# Patient Record
Sex: Male | Born: 1935
Health system: Southern US, Community
[De-identification: ages and names within clinical notes are randomized; demographics above are authoritative.]

## PROBLEM LIST (undated history)

## (undated) DIAGNOSIS — I10 Essential (primary) hypertension: Secondary | ICD-10-CM

## (undated) DIAGNOSIS — I219 Acute myocardial infarction, unspecified: Secondary | ICD-10-CM

## (undated) DIAGNOSIS — K219 Gastro-esophageal reflux disease without esophagitis: Secondary | ICD-10-CM

## (undated) DIAGNOSIS — I639 Cerebral infarction, unspecified: Secondary | ICD-10-CM

## (undated) DIAGNOSIS — I2510 Atherosclerotic heart disease of native coronary artery without angina pectoris: Secondary | ICD-10-CM

## (undated) HISTORY — DX: Atherosclerotic heart disease of native coronary artery without angina pectoris: I25.10

## (undated) HISTORY — DX: Gastro-esophageal reflux disease without esophagitis: K21.9

## (undated) HISTORY — DX: Cerebral infarction, unspecified: I63.9

## (undated) HISTORY — DX: Acute myocardial infarction, unspecified: I21.9

---

## 2006-05-01 HISTORY — PX: COLON SURGERY: SHX602

## 2010-05-01 HISTORY — PX: HERNIA REPAIR: SHX51

## 2010-05-01 HISTORY — PX: CHOLECYSTECTOMY: SHX55

## 2011-05-02 HISTORY — PX: CARDIAC CATHETERIZATION: SHX172

## 2011-10-19 ENCOUNTER — Encounter: Payer: Self-pay | Admitting: *Deleted

## 2011-10-19 NOTE — PMR Pre-admission (Signed)
[facility] PMR Admission Coordinator Pre-Admission Assessment  Patient: Shawn Wiley is an 76 y.o., male MRN: [id number] DOB: 1935/10/12 Height: 5\' 10"  (177.8 cm) Weight: 74.844 kg (165 lb)  Insurance Information HMO:      PPO:       PCP:       IPA:       80/20:       OTHER:   PRIMARY: Medicare A/B      Policy#: [id number]      Subscriber: Benna Dunks CM Name:        Phone#:       Fax#:   Pre-Cert#:        Employer: Retired Benefits:  Phone #:       Name: Armed forces training and education officer. Date: 11/30/02     Deduct: $1184      Out of Pocket Max: none      Life Max: unlimited CIR: 100%      SNF: 100 days  LBD = 02/17/11 Outpatient: 80%     Co-Pay: 20% Home Health: 100%      Co-Pay: none DME: 80%     Co-Pay: 20% Providers: patient's choice  SECONDARY: BCBS      Policy#: [id number]      Subscriber: Higinio Roger CM Name:        Phone#:       Fax#:   Pre-Cert#:        Employer: Retired Benefits:  Phone #:       Name:   Eff. Date:       Deduct:        Out of Pocket Max:        Life Max:   CIR:        SNF:   Outpatient:       Co-Pay:   Home Health:        Co-Pay:   DME:       Co-Pay:    Emergency Contact Information Contact Information    Name Relation Home Work [place] Spouse [phone]  [phone]      Current Medical History  Patient Admitting Diagnosis:  R Frontal-Parietal CVA   History of Present Illness:  Was admitted to [facility] for ERCP secondary to jaundice.  Code Blue called due to developing respiratory distress.  Patient treated for hypoxia and bradycardia.  EKG showed NSTEMI and transferred to ICU.  Surgery consulted for jaundice Dr. Vinson Moselle and Dr. Jennye Boroughs.  Felt to be obstructive jaundice.  Status post cholecystectomy in the past.  ERCP was unremarkable.  Bilirubin elevated.  Cardiology consulted.  MRI 06/13 showed acute/subacute right frontal and parietal CVA.  Transferred to [facility] for cardiac cath on 06/19.   Cardiac cath done 06/20 with no interventions.      NIH Stroke scale: Glascow Coma Scale:  Past Medical History  Past Medical History  Diagnosis Date  . Coronary artery disease   . Myocardial infarction   . Stroke   . GERD (gastroesophageal reflux disease)     Family History  family history is not on file.  Prior Rehab/Hospitalizations: No previous rehab admissions.  Current Medications Zosyn, Metoprolol, lisinopril, lovenox, crestor, ASA, Keppra, Protonix, ceftin    Patients Current Diet:  Dysphagia 3, thin liquids  Precautions / Restrictions Precautions Precautions: Fall Precautions/Special Needs: Swallowing   Prior Activity Level Community (5-7x/wk): Went out daily.  Walked and went to the gym.  Very active.  Home Assistive Devices / Equipment Home  Assistive Devices/Equipment: Eyeglasses   Prior Functional Level Current Functional Level  Bed Mobility  Independent  Max assist   Transfers  Independent  Mod assist   Mobility - Walk/Wheelchair  Independent  Total assist   Upper Body Dressing  Independent  Min assist   Lower Body Dressing  Independent  Max assist   Grooming  Independent  Min assist   Eating/Drinking  Independent  Min assist   Toilet Transfer  Independent  Max assist   Bladder Continence   WNL      Bowel Management  Ileostomy  Ileostomy   Stair Climbing  Able to Take Stairs?: Yes  Other   Communication  WNL  Slurred speech   Memory  WNL  impaired/confused   Cooking/Meal Prep  I      Housework  I    Money Management  I    Driving  Yes     Previous Home Environment Living Arrangements: Spouse/significant other Lives With: Spouse Available Help at Discharge: Family;Available 24 hours/day Type of Home: House Home Layout: One level Home Access: Stairs to enter [employer] of Steps: 4  Discharge Living Setting Plans for Discharge Living Setting: Patient's home;House;Lives with (comment)  (Lives with wife.) Type of Home at Discharge: House Discharge Home Layout: One level Discharge Home Access: Stairs to enter Entrance Stairs-Number of Steps: 4 Do you have any problems obtaining your medications?: No  Social/Family/Support Systems Patient Roles: Spouse;Parent;Other (Comment) Psychiatric nurse at [employer] Information: Jiaire Rosebrook - wife (h) [phone] ([phone] Anticipated Caregiver: wife  Ability/Limitations of Caregiver: wife can assist.  Dtr lives close by. Caregiver Availability: 24/7 Discharge Plan Discussed with Primary Caregiver: Yes Is Caregiver In Agreement with Plan?: Yes Does Caregiver/Family have Issues with Lodging/Transportation while Pt is in Rehab?: No  Goals/Additional Needs Patient/Family Goal for Rehab: PT/OT min A/S goals, ST S goals Expected length of stay: 2-3 weeks Cultural Considerations: Baptist.  Active in church. Dietary Needs: Dys 3 thin liquids Equipment Needs: TBD Pt/Family Agrees to Admission and willing to participate: Yes Program Orientation Provided & Reviewed with Pt/Caregiver Including Roles  & Responsibilities: Yes  Patient Condition: I visited this patient and spoke with patient and wife.  I reviewed the information with Dr. Riley Kill on 10/18/11 and received approval for admission once cardiac cath was complete.  Preadmission Screen Completed By:  Trish Mage, 10/19/2011 1:08 PM ______________________________________________________________________   Discussed status with Dr. Riley Kill on 10/20/11 at 1059 and received telephone approval for admission today.  Admission Coordinator:  Trish Mage, time 1059 Dorna Bloom 10/20/11

## 2011-10-20 ENCOUNTER — Inpatient Hospital Stay (HOSPITAL_COMMUNITY): Admit: 2011-10-20 | Payer: Medicare Other | Admitting: Physical Medicine & Rehabilitation

## 2011-10-20 ENCOUNTER — Other Ambulatory Visit: Payer: Self-pay | Admitting: Physician Assistant

## 2011-10-20 ENCOUNTER — Inpatient Hospital Stay (HOSPITAL_COMMUNITY)
Admission: RE | Admit: 2011-10-20 | Discharge: 2011-11-10 | DRG: 945 | Disposition: A | Discharge status: Home Health | Payer: Medicare Other | Source: Ambulatory Visit | Attending: Physical Medicine & Rehabilitation | Admitting: Physical Medicine & Rehabilitation

## 2011-10-20 ENCOUNTER — Encounter (HOSPITAL_COMMUNITY): Payer: Self-pay | Admitting: Rehabilitation

## 2011-10-20 DIAGNOSIS — R1310 Dysphagia, unspecified: Secondary | ICD-10-CM | POA: Diagnosis present

## 2011-10-20 DIAGNOSIS — I214 Non-ST elevation (NSTEMI) myocardial infarction: Secondary | ICD-10-CM | POA: Diagnosis present

## 2011-10-20 DIAGNOSIS — I6350 Cerebral infarction due to unspecified occlusion or stenosis of unspecified cerebral artery: Secondary | ICD-10-CM | POA: Diagnosis present

## 2011-10-20 DIAGNOSIS — I639 Cerebral infarction, unspecified: Secondary | ICD-10-CM

## 2011-10-20 DIAGNOSIS — D72829 Elevated white blood cell count, unspecified: Secondary | ICD-10-CM | POA: Diagnosis not present

## 2011-10-20 DIAGNOSIS — E785 Hyperlipidemia, unspecified: Secondary | ICD-10-CM | POA: Diagnosis present

## 2011-10-20 DIAGNOSIS — K838 Other specified diseases of biliary tract: Secondary | ICD-10-CM | POA: Diagnosis present

## 2011-10-20 DIAGNOSIS — I6330 Cerebral infarction due to thrombosis of unspecified cerebral artery: Secondary | ICD-10-CM

## 2011-10-20 DIAGNOSIS — I2510 Atherosclerotic heart disease of native coronary artery without angina pectoris: Secondary | ICD-10-CM | POA: Diagnosis present

## 2011-10-20 DIAGNOSIS — Z5189 Encounter for other specified aftercare: Secondary | ICD-10-CM

## 2011-10-20 DIAGNOSIS — I4891 Unspecified atrial fibrillation: Secondary | ICD-10-CM | POA: Diagnosis present

## 2011-10-20 DIAGNOSIS — G4700 Insomnia, unspecified: Secondary | ICD-10-CM | POA: Diagnosis not present

## 2011-10-20 DIAGNOSIS — G8190 Hemiplegia, unspecified affecting unspecified side: Secondary | ICD-10-CM | POA: Diagnosis present

## 2011-10-20 HISTORY — DX: Essential (primary) hypertension: I10

## 2011-10-20 MED ORDER — CEFUROXIME AXETIL 500 MG PO TABS
500.0000 mg | ORAL_TABLET | Freq: Two times a day (BID) | ORAL | Status: AC
  Administered 2011-10-20 – 2011-10-25 (×10): 500 mg via ORAL
  Filled 2011-10-20 (×12): qty 1

## 2011-10-20 MED ORDER — PANTOPRAZOLE SODIUM 40 MG PO TBEC
40.0000 mg | DELAYED_RELEASE_TABLET | Freq: Every day | ORAL | Status: DC
  Administered 2011-10-21 – 2011-11-10 (×21): 40 mg via ORAL
  Filled 2011-10-20 (×23): qty 1

## 2011-10-20 MED ORDER — B COMPLEX-C PO TABS
1.0000 | ORAL_TABLET | Freq: Every day | ORAL | Status: DC
  Administered 2011-10-21 – 2011-11-10 (×21): 1 via ORAL
  Filled 2011-10-20 (×24): qty 1

## 2011-10-20 MED ORDER — TAB-A-VITE/IRON PO TABS
1.0000 | ORAL_TABLET | Freq: Every day | ORAL | Status: DC
  Administered 2011-10-21 – 2011-11-10 (×21): 1 via ORAL
  Filled 2011-10-20 (×25): qty 1

## 2011-10-20 MED ORDER — SORBITOL 70 % SOLN: 30.0000 mL | Freq: Every day | Status: DC | PRN

## 2011-10-20 MED ORDER — RIVAROXABAN 10 MG PO TABS
20.0000 mg | ORAL_TABLET | Freq: Every day | ORAL | Status: DC
  Administered 2011-10-20 – 2011-11-09 (×21): 20 mg via ORAL
  Filled 2011-10-20 (×23): qty 2

## 2011-10-20 MED ORDER — ACETAMINOPHEN 325 MG PO TABS
325.0000 mg | ORAL_TABLET | ORAL | Status: DC | PRN
  Administered 2011-10-23 – 2011-11-09 (×13): 650 mg via ORAL
  Filled 2011-10-20 (×13): qty 2

## 2011-10-20 MED ORDER — ONDANSETRON HCL 4 MG PO TABS: 4.0000 mg | ORAL_TABLET | Freq: Four times a day (QID) | ORAL | Status: DC | PRN

## 2011-10-20 MED ORDER — NITROGLYCERIN 0.4 MG SL SUBL: 0.4000 mg | SUBLINGUAL_TABLET | SUBLINGUAL | Status: DC | PRN

## 2011-10-20 MED ORDER — ASPIRIN EC 81 MG PO TBEC
81.0000 mg | DELAYED_RELEASE_TABLET | Freq: Every day | ORAL | Status: DC
  Administered 2011-10-21 – 2011-11-10 (×21): 81 mg via ORAL
  Filled 2011-10-20 (×22): qty 1

## 2011-10-20 MED ORDER — LISINOPRIL 10 MG PO TABS
10.0000 mg | ORAL_TABLET | Freq: Every day | ORAL | Status: DC
  Administered 2011-10-21 – 2011-11-04 (×14): 10 mg via ORAL
  Filled 2011-10-20 (×20): qty 1

## 2011-10-20 MED ORDER — POLYETHYLENE GLYCOL 3350 17 G PO PACK
17.0000 g | PACK | Freq: Every day | ORAL | Status: DC | PRN
  Filled 2011-10-20: qty 1

## 2011-10-20 MED ORDER — ONDANSETRON HCL 4 MG/2ML IJ SOLN: 4.0000 mg | Freq: Four times a day (QID) | INTRAMUSCULAR | Status: DC | PRN

## 2011-10-20 MED ORDER — METOPROLOL TARTRATE 25 MG PO TABS
25.0000 mg | ORAL_TABLET | Freq: Two times a day (BID) | ORAL | Status: DC
  Administered 2011-10-20 – 2011-11-04 (×31): 25 mg via ORAL
  Filled 2011-10-20 (×37): qty 1

## 2011-10-20 MED ORDER — ASPIRIN 325 MG PO TABS
325.0000 mg | ORAL_TABLET | Freq: Every day | ORAL | Status: DC
  Filled 2011-10-20: qty 1

## 2011-10-20 NOTE — H&P (Incomplete)
Physical Medicine and Rehabilitation Admission H&P    No chief complaint on file. : HPI: 76 year old right-handed male with previous subtotal colectomy, ileostomy and cholecystectomy. Admitted to [facility] 10/09/2011 with abdominal pain. Patient states that on 10/06/2011 (having abdominal discomfort in the right upper quadrant as well as left upper quadrant. He stated that his ileostomy is working regularly. Patient became jaundice as well his mild elevation in liver function studies. Noted bilirubin of 4.1. Patient plan was for ERCP per Dr. Derenda Fennel. During procedure CODE BLUE was called as patient developed acute onset of respiratory distress and hypoxia with bradycardia. Initial heart rates were in the 40s and oxygen saturations in the 70s. EKG showed ST segment elevation myocardial infarction, he was transferred to intensive care unit. Noted by ERCP felt to be obstructive jaundice. He also undergone endoscopic retrograde cholangiopancreatography that was unremarkable. Cardiology was consulted and echocardiogram completed that showed mild concentric left ventricular hypertrophy with ejection fraction 65%. Patient with noted left-sided weakness and slurred speech after ERCP. MRI of the brain June 13 showed restricted diffusion in the right frontal and parietal cortex compatible with acute or subacute infarct. No hemorrhage or mass noted. CTA of the chest showed no pulmonary embolism. Carotid Doppler showed calcified plaque bilaterally in the carotid bulbs around 50-69% stenosis suspected bilaterally. Patient was placed on aspirin therapy as well as subcutaneous Lovenox for DVT prophylaxis. Pertaining to his obstructive jaundice he had been placed on Zosyn and changed to Ceftin which was felt to be well for any cholecystitis type problems and patient remained afebrile. Therapies were initiated with slow progress. Patient noted left hemiplegia with neglect which limited his overall mobility.  He was transferred to [facility] 10/18/2011 for heart catheterization after acute non-ST segment elevation myocardial infarction with cardiac cath unremarkable and advised medical regimen of continuing aspirin but his subcutaneous Lovenox was discontinued at that time and placed on Xarelto for paroxysmal atrial flutter. Patient was admitted to [facility] inpatient rehabilitation services for comprehensive rehabilitation program secondary to deconditioning after CVA/myocardial infarction  Review of Systems  Gastrointestinal: Positive for nausea and abdominal pain.  All other systems reviewed and are negative.   Past Medical History  Diagnosis Date  . Coronary artery disease   . Myocardial infarction   . Stroke   . GERD (gastroesophageal reflux disease)    Past Surgical History  Procedure Date  . Colon surgery   . Cholecystectomy   . Hernia repair    No family history on file. Social History:  does not have a smoking history on file. He does not have any smokeless tobacco history on file. His alcohol and drug histories not on file. Allergies: NKA  (Not in a hospital admission)  Home: Patient is married and retired. 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 They live in a one level home with 4 steps to entry. Patient denies any tobacco or alcohol use. Wife and family can assist        Functional History:   Independent prior to admission and active  Functional Status:  Mobility:   Currently requires max assist for bed mobility moderate to max assist transfers        ADL:   min mod assist for ADLs  Cognition:   patient with dysarthric speech but intelligible.     There were no vitals taken for this visit. 118/70 P 74 RR 18 T 99 Physical Exam  Vitals reviewed. Constitutional: He appears well-developed.  HENT:  Head: Normocephalic.  Neck: Neck supple. No thyromegaly present.  Cardiovascular: Regular rhythm.   Pulmonary/Chest: Breath  sounds normal. No respiratory distress. He has no wheezes.  Abdominal: He exhibits no distension. There is no tenderness.  Musculoskeletal: He exhibits no edema.  Neurological: He is alert.       Speech is dysarthric but intelligible. He was able to name person place and his date of birth. He follows simple commands. Patient does display some left-sided neglect.  Skin: Skin is warm and dry.  Psychiatric: He has a normal mood and affect.   ileostomy is in place and draining without difficulty  No results found for this or any previous visit (from the past 48 hour(s)). No results found.  Post Admission Physician Evaluation: 1. Functional deficits secondary  to ***. 2. Patient is admitted to receive collaborative, interdisciplinary care between the physiatrist, rehab nursing staff, and therapy team. 3. Patient's level of medical complexity and substantial therapy needs in context of that medical necessity cannot be provided at a lesser intensity of care such as a SNF. 4. Patient has experienced substantial functional loss from his/her baseline which was documented above under the "Functional History" and "Functional Status" headings.  Judging by the patient's diagnosis, physical exam, and functional history, the patient has potential for functional progress which will result in measurable gains while on inpatient rehab.  These gains will be of substantial and practical use upon discharge  in facilitating mobility and self-care at the household level. 5. Physiatrist will provide 24 hour management of medical needs as well as oversight of the therapy plan/treatment and provide guidance as appropriate regarding the interaction of the two. 6. 24 hour rehab nursing will assist with {due ZO:1096045}  and help integrate therapy concepts, techniques,education, etc. 7. PT will assess and treat for:  ***.  Goals are: ***. 8. OT will assess and treat for: ***.   Goals are: ***. 9. SLP will assess and treat  for: ***.  Goals are: ***. 10. Case Management and Social Worker will assess and treat for psychological issues and discharge planning. 11. Team conference will be held weekly to assess progress toward goals and to determine barriers to discharge. 12. Patient will receive at least 3 hours of therapy per day at least 5 days per week. 13. ELOS: ***      Prognosis:  {potential:3041437}   Medical Problem List and Plan: 1. Right frontal parietal CVA after ERCP 2. DVT Prophylaxis/Anticoagulation: SCDs. Monitor for any signs of DVT. 3. Acute non-ST segment elevation myocardial infarction. Cardiac cath unremarkable. No complaints of chest pain or shortness of breath. Continue medical management with aspirin and Xarelto 4. Obstructive jaundice/history ileostomy. Monitor LFTs and routine care of ileostomy. Continue Protonix 5. Hypertension. Lisinopril 10 mg daily and Lopressor 25 mg twice a day. Monitor with increased mobility 6. PAF. Xarelto 20 mg daily. Cardiac rate controlled 6. Hyperlipidemia. Crestor 7. Dysphagia. Currently on dysphagia 3 nectar liquids. We'll have speech therapy followup   10/20/2011, 6:04 AM

## 2011-10-20 NOTE — Plan of Care (Signed)
Overall Plan of Care [facility]) Patient Details Name: Shawn Wiley MRN: [id number] DOB: 07-17-1935  Diagnosis:  Rehabilitation for CVA  Primary Diagnosis:    CVA (cerebral infarction) Co-morbidities: hx colostomy, deconditioning, HTN, hx jaundice,dysphagia  Functional Problem List  Patient demonstrates impairments in the following areas: Balance, Cognition, Endurance and Motor  Basic ADL's: eating, grooming, bathing, dressing and toileting Advanced ADL's: simple meal preparation  Transfers:  bed mobility, bed to chair, toilet, tub/shower, car, furniture and floor Locomotion:  ambulation, wheelchair mobility and stairs  Additional Impairments:  Swallowing, Communication  expression and Social Cognition   problem solving and awareness  Anticipated Outcomes Item Anticipated Outcome  Eating/Swallowing    Basic self-care  Supervision  Tolieting  Modified independent  Bowel/Bladder  Min assist  Transfers  Min-Assist  Locomotion  Min-Assist   Communication    Cognition  Supervision  Pain  Less than 3  Safety/Judgment    Other     Therapy Plan: PT Frequency: 1-2 X/day, 60-90 minutes OT Frequency: 1-2 X/day, 60-90 minutes SLP Frequency: 1-2 X/day, 30-60 minutes   Team Interventions: Item RN PT OT SLP SW TR Other  Self Care/Advanced ADL Retraining   x      Neuromuscular Re-Education  x x      Therapeutic Activities  x x x     UE/LE Strength Training/ROM  x x      UE/LE Coordination Activities  x x      Visual/Perceptual Remediation/Compensation   x      DME/Adaptive Equipment Instruction  x x      Therapeutic Exercise  x x      Balance/Vestibular Training  x x      Patient/Family Education  x x x     Cognitive Remediation/Compensation    x     Functional Mobility Training  x x      Ambulation/Gait Training  x       Museum/gallery curator  x       Wheelchair Propulsion/Positioning  x x      Functional Programmer, multimedia    x     Speech/Language Facilitation    x     Bladder Management x        Bowel Management x        Disease Management/Prevention         Pain Management x        Medication Management x        Skin Care/Wound Management         Splinting/Orthotics  x       Discharge Planning  x       Psychosocial Support  x  x         x               Team Discharge Planning: Destination:  Home Projected Follow-up:  PT and Home Health Projected Equipment Needs:  TBD Patient/family involved in discharge planning:  Yes  MD ELOS: 3 wks Medical Rehab Prognosis:  Excellent Assessment: 76 yo male with complications after ERCP including R CVA causing L HP now requiring 24/7 Rheab MD/RN, CIR level PT,OT,SLP

## 2011-10-20 NOTE — H&P (Signed)
Physical Medicine and Rehabilitation Admission H&P  No chief complaint on file.  :  HPI: 76 year old right-handed male with previous subtotal colectomy, ileostomy and cholecystectomy. Admitted to [facility] 10/09/2011 with abdominal pain. Patient states that on 10/06/2011 (having abdominal discomfort in the right upper quadrant as well as left upper quadrant. He stated that his ileostomy is working regularly. Patient became jaundice as well his mild elevation in liver function studies. Noted bilirubin of 4.1. Patient plan was for ERCP per Dr. Derenda Fennel. During procedure CODE BLUE was called as patient developed acute onset of respiratory distress and hypoxia with bradycardia. Initial heart rates were in the 40s and oxygen saturations in the 70s. EKG showed ST segment elevation myocardial infarction, he was transferred to intensive care unit. Noted by ERCP felt to be obstructive jaundice. He also undergone endoscopic retrograde cholangiopancreatography that was unremarkable. Cardiology was consulted and echocardiogram completed that showed mild concentric left ventricular hypertrophy with ejection fraction 65%. Patient with noted left-sided weakness and slurred speech after ERCP. MRI of the brain June 13 showed restricted diffusion in the right frontal and parietal cortex compatible with acute or subacute infarct. No hemorrhage or mass noted. CTA of the chest showed no pulmonary embolism. Carotid Doppler showed calcified plaque bilaterally in the carotid bulbs around 50-69% stenosis suspected bilaterally. Patient was placed on aspirin therapy as well as subcutaneous Lovenox for DVT prophylaxis. Pertaining to his obstructive jaundice he had been placed on Zosyn and changed to Ceftin which was felt to be well for any cholecystitis type problems and patient remained afebrile. Therapies were initiated with slow progress. Patient noted left hemiplegia with neglect which limited his overall mobility. He  was transferred to [facility] 10/18/2011 for heart catheterization after acute non-ST segment elevation myocardial infarction with cardiac cath unremarkable and advised medical regimen of continuing aspirin but his subcutaneous Lovenox was discontinued at that time and placed on Xarelto for paroxysmal atrial flutter. Patient was admitted to [facility] inpatient rehabilitation services for comprehensive rehabilitation program secondary to deconditioning after CVA/myocardial infarction  Review of Systems  Gastrointestinal: Positive for nausea and abdominal pain.  All other systems reviewed and are negative.   Past Medical History   Diagnosis  Date   .  Coronary artery disease    .  Myocardial infarction    .  Stroke    .  GERD (gastroesophageal reflux disease)     Past Surgical History   Procedure  Date   .  Colon surgery    .  Cholecystectomy    .  Hernia repair     No family history on file.  Social History: does not have a smoking history on file. He does not have any smokeless tobacco history on file. His alcohol and drug histories not on file.  Allergies: NKA   (Not in a hospital admission)  Home: Patient is married and retired. 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 They live in a one level home with 4 steps to entry. Patient denies any tobacco or alcohol use. Wife and family can assist   Functional History:  Independent prior to admission and active  Functional Status:  Mobility:  Currently requires max assist for bed mobility moderate to max assist transfers     ADL:  min mod assist for ADLs  Cognition:  patient with dysarthric speech but intelligible.   There were no vitals taken for this visit.  118/70 P 74 RR 18 T 99  Physical  Exam  Vitals reviewed.  Constitutional: He appears well-developed. Alert to name, place,reason HENT: oral mucosa pink and moist Head: Normocephalic. sclera slightly icteric Neck: Neck supple. No  thyromegaly present.  Cardiovascular: Regular rhythm. No murmurs rubs or gallops Pulmonary/Chest: Breath sounds normal. No respiratory distress. He has no wheezes.  Abdominal: He exhibits no distension. There is no tenderness. Bowel sounds positive, ostomy intact Musculoskeletal: He exhibits no edema.  Neurological: He is alert.  Speech is dysarthric but intelligible. He was able to name person place and his date of birth. He follows simple commands. Patient does display some left-sided neglect. RUE is 1-2 at deltoid, triceps, trace to absent elsewhere- extensor pattern. LE grossly 0-1/5 with extensor pattern also. DTR's are hyperreflexive at 3+ on the right. 3 beats clonus right ankle with plantar flexor tightness. Sensory 1/2 on right arm and leg. Left facial droop and tongue deviation. Skin: Skin is warm and dry.  Psychiatric: He has a normal mood and affect. Fair insight and awareness. Memory fair.  ileostomy is in place and draining without difficulty  No results found for this or any previous visit (from the past 48 hour(s)).  No results found.    Post Admission Physician Evaluation:  1. Functional deficits secondary to right frontal-parietal infarct with left hemiparesis. 2. Patient is admitted to receive collaborative, interdisciplinary care between the physiatrist, rehab nursing staff, and therapy team. 3. Patient's level of medical complexity and substantial therapy needs in context of that medical necessity cannot be provided at a lesser intensity of care such as a SNF. 4. Patient has experienced substantial functional loss from his/her baseline which was documented above under the "Functional History" and "Functional Status" headings. Judging by the patient's diagnosis, physical exam, and functional history, the patient has potential for functional progress which will result in measurable gains while on inpatient rehab. These gains will be of substantial and practical use upon discharge  in facilitating mobility and self-care at the household level. 5. Physiatrist will provide 24 hour management of medical needs as well as oversight of the therapy plan/treatment and provide guidance as appropriate regarding the interaction of the two. 6. 24 hour rehab nursing will assist with bladder management, bowel management, safety, skin/wound care, disease management, medication administration, pain management and patient education and help integrate therapy concepts, techniques,education, etc. 7. PT will assess and treat for: lower extremity strength, NMR, visuospatial awareness, safety, tone mgt, adaptive equipment, functional mobility and family ed. Goals are: minimal assist. 8. OT will assess and treat for: UES, ROM, ADL's, fxnl mobiltiy, visuospatial awareness,adaptive equipment, NMR, tone mgt, family ed. Goals are: minimal assist. 9. SLP will assess and treat for: cognition, communication, swallowing. Goals are: supervision to min assist. 10. Case Management and Social Worker will assess and treat for psychological issues and discharge planning. 11. Team conference will be held weekly to assess progress toward goals and to determine barriers to discharge. 12. Patient will receive at least 3 hours of therapy per day at least 5 days per week. 13. ELOS: 3 weeks Prognosis: good   Medical Problem List and Plan:  1. Right frontal parietal CVA after ERCP  2. DVT Prophylaxis/Anticoagulation: SCDs. Monitor for any signs of DVT.  3. Acute non-ST segment elevation myocardial infarction. Cardiac cath unremarkable. No complaints of chest pain or shortness of breath. Continue medical management with aspirin and Xarelto  4. Obstructive jaundice/history ileostomy. Monitor LFTs and routine care of ileostomy. Continue Protonix   -pt,wife had been independent with ostomy care PTA  -  serial labs 5. Hypertension. Lisinopril 10 mg daily and Lopressor 25 mg twice a day. Monitor with increased mobility  6.  PAF. Xarelto 20 mg daily. Cardiac rate controlled  6. Hyperlipidemia. Crestor  7. Dysphagia. Currently on dysphagia 3 nectar liquids. We'll have speech therapy followup    8. Tone: ROM, splinting- consider medication if resistant to conservative measures  Ivory Broad, MD 10/20/11

## 2011-10-21 DIAGNOSIS — Z5189 Encounter for other specified aftercare: Secondary | ICD-10-CM

## 2011-10-21 DIAGNOSIS — I6330 Cerebral infarction due to thrombosis of unspecified cerebral artery: Secondary | ICD-10-CM

## 2011-10-21 MED ORDER — ZOLPIDEM TARTRATE 5 MG PO TABS
5.0000 mg | ORAL_TABLET | Freq: Every evening | ORAL | Status: DC | PRN
  Administered 2011-10-21 – 2011-10-24 (×3): 5 mg via ORAL
  Filled 2011-10-21 (×3): qty 1

## 2011-10-21 NOTE — Progress Notes (Signed)
76 year old right-handed male with previous subtotal colectomy, ileostomy and cholecystectomy. Admitted to [facility] 10/09/2011 with abdominal pain. Patient states that on 10/06/2011 (having abdominal discomfort in the right upper quadrant as well as left upper quadrant. He stated that his ileostomy is working regularly. Patient became jaundice as well his mild elevation in liver function studies. Noted bilirubin of 4.1. Patient plan was for ERCP per Dr. Derenda Fennel. During procedure CODE BLUE was called as patient developed acute onset of respiratory distress and hypoxia with bradycardia. Initial heart rates were in the 40s and oxygen saturations in the 70s. EKG showed ST segment elevation myocardial infarction, he was transferred to intensive care unit. Noted by ERCP felt to be obstructive jaundice. He also undergone endoscopic retrograde cholangiopancreatography that was unremarkable. Cardiology was consulted and echocardiogram completed that showed mild concentric left ventricular hypertrophy with ejection fraction 65%. Patient with noted left-sided weakness and slurred speech after ERCP. MRI of the brain June 13 showed restricted diffusion in the right frontal and parietal cortex compatible with acute or subacute infarct. No hemorrhage or mass noted. CTA of the chest showed no pulmonary embolism. Carotid Doppler showed calcified plaque bilaterally in the carotid bulbs around 50-69% stenosis suspected bilaterally. Patient was placed on aspirin therapy as well as subcutaneous Lovenox for DVT prophylaxis. Pertaining to his obstructive jaundice he had been placed on Zosyn and changed to Ceftin which was felt to be well for any cholecystitis type problems and patient remained afebrile. Therapies were initiated with slow progress. Patient noted left hemiplegia with neglect which limited his overall mobility. He was transferred to [facility] 10/18/2011 for heart catheterization after  acute non-ST segment elevation myocardial infarction with cardiac cath unremarkable and advised medical regimen of continuing aspirin but his subcutaneous Lovenox was discontinued at that time and placed on Xarelto for paroxysmal atrial flutter.   Subjective: Patient has no specific complaints today.  BP 117/78  Pulse 76  Temp 98.7 F (37.1 C) (Oral)  Resp 17  Ht 5\' 10"  (1.778 m)  Wt 165 lb 9.1 oz (75.1 kg)  BMI 23.76 kg/m2  SpO2 95%  Elderly male in no acute distress. Neck supple. Chest clear to auscultation cardiac exam S1-S2 are regular. Abdominal exam active bowel sounds, soft.  Post Admission Physician Evaluation:  1. Functional deficits secondary to right frontal-parietal infarct with left hemiparesis. 2. Patient is admitted to receive collaborative, interdisciplinary care between the physiatrist, rehab nursing staff, and therapy team. 3. Patient's level of medical complexity and substantial therapy needs in context of that medical necessity cannot be provided at a lesser intensity of care such as a SNF. 4. Patient has experienced substantial functional loss from his/her baseline which was documented above under the "Functional History" and "Functional Status" headings. Judging by the patient's diagnosis, physical exam, and functional history, the patient has potential for functional progress which will result in measurable gains while on inpatient rehab. These gains will be of substantial and practical use upon discharge in facilitating mobility and self-care at the household level. 5. Physiatrist will provide 24 hour management of medical needs as well as oversight of the therapy plan/treatment and provide guidance as appropriate regarding the interaction of the two. 6. 24 hour rehab nursing will assist with bladder management, bowel management, safety, skin/wound care, disease management, medication administration, pain management and patient education and help integrate therapy  concepts, techniques,education, etc. 7. PT will assess and treat for: lower extremity strength, NMR, visuospatial awareness, safety, tone mgt, adaptive equipment,  functional mobility and family ed. Goals are: minimal assist. 8. OT will assess and treat for: UES, ROM, ADL's, fxnl mobiltiy, visuospatial awareness,adaptive equipment, NMR, tone mgt, family ed. Goals are: minimal assist. 9. SLP will assess and treat for: cognition, communication, swallowing. Goals are: supervision to min assist. 10. Case Management and Social Worker will assess and treat for psychological issues and discharge planning. 11. Team conference will be held weekly to assess progress toward goals and to determine barriers to discharge. 12. Patient will receive at least 3 hours of therapy per day at least 5 days per week. 13. ELOS: 3 weeks Prognosis: good Medical Problem List and Plan:  1. Right frontal parietal CVA after ERCP  2. DVT Prophylaxis/Anticoagulation: SCDs. Monitor for any signs of DVT.  3. Acute non-ST segment elevation myocardial infarction. Cardiac cath unremarkable. No complaints of chest pain or shortness of breath. Continue medical management with aspirin and Xarelto  4. Obstructive jaundice/history ileostomy. Monitor LFTs and routine care of ileostomy. Continue Protonix  -pt,wife had been independent with ostomy care PTA  -serial labs  5. Hypertension. Lisinopril 10 mg daily and Lopressor 25 mg twice a day. Monitor with increased mobility  6. PAF. Xarelto 20 mg daily. Cardiac rate controlled  6. Hyperlipidemia. Crestor  7. Dysphagia. Currently on dysphagia 3 nectar liquids. We'll have speech therapy followup  8. Tone: ROM, splinting- consider medication if resistant to conservative measures

## 2011-10-21 NOTE — Evaluation (Signed)
Physical Therapy Assessment and Plan  Patient Details  Name: Shawn Wiley MRN: [id number] Date of Birth: 16-Mar-1936  PT Diagnosis: Abnormal posture, Abnormality of gait, Hemiparesis non-dominant, Impaired sensation and Muscle weakness Rehab Potential: Good ELOS: 2-3 weeks   Today's Date: 10/21/2011 Time: 0730-0830 Time Calculation (min): 60 min  Problem List:  Patient Active Problem List  Diagnosis  . CVA (cerebral infarction)    Past Medical History:  Past Medical History  Diagnosis Date  . Coronary artery disease   . Myocardial infarction   . Stroke   . GERD (gastroesophageal reflux disease)   . Hypertension    Past Surgical History:  Past Surgical History  Procedure Date  . Hernia repair 2012  . Cholecystectomy 2012  . Colon surgery 2008  . Cardiac catheterization 2013    Assessment & Plan Clinical Impression:76 year old right-handed male with previous subtotal colectomy, ileostomy and cholecystectomy. Admitted to [facility] 10/09/2011 with abdominal pain. Patient states that on 10/06/2011 (having abdominal discomfort in the right upper quadrant as well as left upper quadrant. He stated that his ileostomy is working regularly. Patient became jaundice as well his mild elevation in liver function studies. Noted bilirubin of 4.1. Patient plan was for ERCP per Dr. Derenda Fennel. During procedure CODE BLUE was called as patient developed acute onset of respiratory distress and hypoxia with bradycardia. Initial heart rates were in the 40s and oxygen saturations in the 70s. EKG showed ST segment elevation myocardial infarction, he was transferred to intensive care unit. Noted by ERCP felt to be obstructive jaundice. He also undergone endoscopic retrograde cholangiopancreatography that was unremarkable. Cardiology was consulted and echocardiogram completed that showed mild concentric left ventricular hypertrophy with ejection fraction 65%. Patient with noted left-sided  weakness and slurred speech after ERCP. MRI of the brain June 13 showed restricted diffusion in the right frontal and parietal cortex compatible with acute or subacute infarct. No hemorrhage or mass noted. CTA of the chest showed no pulmonary embolism. Carotid Doppler showed calcified plaque bilaterally in the carotid bulbs around 50-69% stenosis suspected bilaterally. Patient was placed on aspirin therapy as well as subcutaneous Lovenox for DVT prophylaxis. Pertaining to his obstructive jaundice he had been placed on Zosyn and changed to Ceftin which was felt to be well for any cholecystitis type problems and patient remained afebrile. Therapies were initiated with slow progress. Patient noted left hemiplegia with neglect which limited his overall mobility. He was transferred to [facility] 10/18/2011 for heart catheterization after acute non-ST segment elevation myocardial infarction with cardiac cath unremarkable and advised medical regimen of continuing aspirin but his subcutaneous Lovenox was discontinued at that time and placed on Xarelto for paroxysmal atrial flutter. Patient was admitted to [facility] inpatient rehabilitation services for comprehensive rehabilitation program secondary to deconditioning after CVA/myocardial infarction.    Patient transferred to CIR on 10/20/2011 .   Patient currently requires max with mobility secondary to impaired timing and sequencing, abnormal tone, unbalanced muscle activation and decreased coordination.  Prior to hospitalization, patient was Independent with mobility and lived with Spouse in a House home.  Home access is Four 4-inch stepsStairs to enter.  Patient will benefit from skilled PT intervention to maximize safe functional mobility, minimize fall risk and decrease caregiver burden for planned discharge home with 24 hour assist.  Anticipate patient will benefit from follow up [facility] at discharge.  PT - End of Session Endurance  Deficit: No PT Assessment Rehab Potential: Good Barriers to Discharge: None PT Plan  PT Frequency: 1-2 X/day, 60-90 minutes PT Treatment/Interventions: Ambulation/gait training;Balance/vestibular training;Discharge planning;DME/adaptive equipment instruction;Functional mobility training;Neuromuscular re-education;Patient/family education;Splinting/orthotics;Stair training;Therapeutic Activities;Therapeutic Exercise;UE/LE Strength taining/ROM;UE/LE Coordination activities PT Recommendation Follow Up Recommendations: Home health PT  PT Evaluation Precautions/Restrictions Precautions Precautions: Fall Restrictions Weight Bearing Restrictions: No General @[id number] [phone]) Vital Signs Therapy Vitals Temp: 98.7 F (37.1 C) Temp src: Oral Pulse Rate: 76  Resp: 17  BP: 117/78 mmHg Patient Position, if appropriate: Lying Oxygen Therapy SpO2: 95 % O2 Device: None (Room air) Pain Pain Assessment Pain Assessment: No/denies pain Home Living/Prior Functioning Home Living Lives With: Spouse Available Help at Discharge: Family;Available 24 hours/day Type of Home: House Home Access: Stairs to enter [employer] of Steps: Four 4-inch steps Entrance Stairs-Rails: Right Home Layout: One level Bathroom Shower/Tub: Health visitor: Handicapped height Bathroom Accessibility: Yes How Accessible: Accessible via walker Home Adaptive Equipment: Bedside commode/3-in-1;Grab bars in shower;Built-in shower seat;Tub transfer bench;Reacher;Walker - rolling;Straight cane;Quad cane Prior Function Level of Independence: Independent with homemaking with ambulation;Independent with gait Able to Take Stairs?: Reciprically Driving: Yes Vocation: Other (comment) Leisure: Hobbies-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 Vision/Perception  Vision - History Baseline Vision: No visual deficits  Cognition Overall Cognitive Status: Appears within functional  limits for tasks assessed Arousal/Alertness: Awake/alert Orientation Level: Oriented X4 Attention: Sustained Sensation Sensation Light Touch: Impaired by gross assessment (Tingling sensation at L forearm/fingers, L Lower leg/ft) Motor  Motor Motor: Hemiplegia  Mobility Bed Mobility Bed Mobility: Rolling Right;Rolling Left;Right Sidelying to Sit Locomotion  Ambulation Ambulation: Yes Ambulation/Gait Assistance: 2: Max assist Ambulation Distance (Feet): 3 Feet Assistive device: 1 person hand held assist Ambulation/Gait Assistance Details: Tactile cues for sequencing;Tactile cues for weight shifting;Verbal cues for sequencing;Verbal cues for technique;Verbal cues for precautions/safety;Verbal cues for gait pattern;Manual facilitation for weight shifting;Manual facilitation for placement;Manual facilitation for weight bearing Gait Gait: Yes Gait Pattern: Impaired Gait Pattern: Step-to pattern;Decreased stance time - left;Decreased hip/knee flexion - left;Decreased dorsiflexion - left;Decreased weight shift to left;Trunk rotated posteriorly on right Stairs / Additional Locomotion Stairs: No Curb: Not tested (comment) Wheelchair Mobility Wheelchair Mobility: No  Trunk/Postural Assessment  Thoracic Assessment Thoracic Assessment: Exceptions to [facility] (L side weakness) Lumbar Assessment Lumbar Assessment: Exceptions to [facility] (L side weakness) Postural Control Postural Control: Deficits on evaluation (increased posterior pelvic rotation, Right trunk lean)  Balance Balance Balance Assessed: Yes (static sitting with feet supported and R UE support S/Mod-I) Extremity Assessment      RLE Assessment RLE Assessment: Within Functional Limits LLE Assessment LLE Assessment: Exceptions to WFL (ROM limited L ankle dorsiflexion 10 from neutral with tone)  See FIM for current functional status Refer to Care Plan for Long Term Goals  Recommendations for other services: None  Discharge  Criteria: Patient will be discharged from PT if patient refuses treatment 3 consecutive times without medical reason, if treatment goals not met, if there is a change in medical status, if patient makes no progress towards goals or if patient is discharged from hospital.  The above assessment, treatment plan, treatment alternatives and goals were discussed and mutually agreed upon: by patient and by family  Rex Kras 10/21/2011, 8:34 AM

## 2011-10-21 NOTE — Evaluation (Signed)
Occupational Therapy Assessment and Plan  Patient Details  Name: Shawn Wiley MRN: [id number] Date of Birth: 10/07/1935  OT Diagnosis: hemiplegia affecting non-dominant side Rehab Potential: Rehab Potential: Good ELOS: 14-21 days   Today's Date: 10/21/2011 Time: 0900-1005 Time Calculation (min): 65 min  Problem List:  Patient Active Problem List  Diagnosis  . CVA (cerebral infarction)    Past Medical History:  Past Medical History  Diagnosis Date  . Coronary artery disease   . Myocardial infarction   . Stroke   . GERD (gastroesophageal reflux disease)   . Hypertension    Past Surgical History:  Past Surgical History  Procedure Date  . Hernia repair 2012  . Cholecystectomy 2012  . Colon surgery 2008  . Cardiac catheterization 2013    Assessment & Plan Clinical Impression: Patient is a 76 year old right-handed male with previous subtotal colectomy, ileostomy and cholecystectomy. Admitted to [facility] 10/09/2011 with abdominal pain. Patient states that on 10/06/2011 (having abdominal discomfort in the right upper quadrant as well as left upper quadrant. Patient became jaundice as well his mild elevation in liver function studies. Noted bilirubin of 4.1Plan was for ERCP per Dr. Derenda Fennel but during procedure CODE BLUE was called as patient developed acute onset of respiratory distress and hypoxia with bradycardia. Initial heart rates were in the 40s and oxygen saturations in the 70s.   EKG showed ST segment elevation myocardial infarction.    Patient with noted left-sided weakness and slurred speech after ERCP. MRI of the brain June 13 showed restricted diffusion in the right frontal and parietal cortex compatible with acute or subacute infarct. No hemorrhage or mass noted. CTA of the chest showed no pulmonary embolism. Therapies were initiated with slow progress. Patient noted left hemiplegia with neglect which limited his overall mobility. He was transferred to  [facility] 10/18/2011 for heart catheterization after acute non-ST segment elevation myocardial infarction with cardiac cath unremarkable and advised medical regimen of continuing for paroxysmal atrial flutter. Patient was admitted to [facility] inpatient rehabilitation services for comprehensive rehabilitation program secondary to deconditioning after CVA/myocardial infarction.  Patient transferred to CIR on 10/20/2011 .    Patient currently requires max with basic self-care skills secondary to impaired timing and sequencing and unbalanced muscle activation.  Prior to hospitalization, patient could complete BADL and IADL with independently.  Patient will benefit from skilled intervention to increase independence with basic self-care skills prior to discharge home with care partner.  Anticipate patient will require intermittent supervision and follow up home health.  OT - End of Session Activity Tolerance: Tolerates 30+ min activity with multiple rests Endurance Deficit: No OT Assessment Rehab Potential: Good OT Plan OT Frequency: 1-2 X/day, 60-90 minutes Estimated Length of Stay: 14-21 days OT Treatment/Interventions: Balance/vestibular training;DME/adaptive equipment instruction;Functional mobility training;Neuromuscular re-education;Patient/family education;Self Care/advanced ADL retraining;Splinting/orthotics;UE/LE Strength taining/ROM;Therapeutic Exercise;Therapeutic Activities;UE/LE Coordination activities;Visual/perceptual remediation/compensation OT Recommendation Follow Up Recommendations: Home health OT  OT Evaluation Precautions/Restrictions  Precautions Precautions: Fall  General Chart Reviewed: Yes Family/Caregiver Present: Yes (spouse, Shawn Wiley)  Pain Pain Assessment Pain Assessment: No/denies pain  Home Living/Prior Functioning Home Living Lives With: Spouse Available Help at Discharge: Family Type of Home: House Home Access: Stairs to  enter Secretary/administrator of Steps: Four 4-inch steps Entrance Stairs-Rails: Right Home Layout: One level Bathroom Shower/Tub: Health visitor: Handicapped height Bathroom Accessibility: Yes How Accessible: Accessible via walker Home Adaptive Equipment: Bedside commode/3-in-1;Built-in shower seat;Straight cane;Walker - rolling IADL History Homemaking Responsibilities: Yes Meal Prep  Responsibility: Secondary Laundry Responsibility: Secondary Cleaning Responsibility: Secondary Bill Paying/Finance Responsibility: No Shopping Responsibility: Secondary Child Care Responsibility: No Current License: Yes Mode of Transportation: Car Education: HS Occupation: Retired 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, 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) Leisure and Hobbies: boating, church choir Prior Function Level of Independence: Independent with basic ADLs;Independent with transfers;Independent with gait Able to Take Stairs?: Yes Driving: Yes Vocation: Retired Leisure: Hobbies-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  Vision/Perception  Vision - History Baseline Vision: No visual deficits Patient Visual Report: No change from baseline Vision - Assessment Eye Alignment: Within Functional Limits Perception Perception: Within Functional Limits Praxis Praxis: Intact   Cognition Overall Cognitive Status: Appears within functional limits for tasks assessed Arousal/Alertness: Awake/alert Orientation Level: Oriented X4 Attention: Alternating Focused Attention: Appears intact Sustained Attention: Appears intact Alternating Attention: Appears intact Safety/Judgment: Appears intact  Sensation Sensation Light Touch: Appears Intact Coordination Gross Motor Movements are Fluid and Coordinated: No Fine Motor Movements are Fluid and Coordinated: No Motor  Motor Motor: Hemiplegia Motor - Skilled Clinical Observations: left hemiplegia  Mobility  Bed  Mobility Bed Mobility: Rolling Right;Rolling Left;Right Sidelying to Sit   Trunk/Postural Assessment  Postural Control Postural Control: Within Functional Limits   Extremity/Trunk Assessment RUE Assessment RUE Assessment: Within Functional Limits LUE Assessment LUE Assessment: Exceptions to [facility] LUE Strength LUE Overall Strength: Deficits (0/5) LUE Tone LUE Tone: Flaccid  See FIM for current functional status Refer to Care Plan for Long Term Goals  Recommendations for other services: None  Discharge Criteria: Patient will be discharged from OT if patient refuses treatment 3 consecutive times without medical reason, if treatment goals not met, if there is a change in medical status, if patient makes no progress towards goals or if patient is discharged from hospital.  The above assessment, treatment plan, treatment alternatives and goals were discussed and mutually agreed upon: by patient and by family  0900-1005 - 8 Minutes  Individual Therapy  No complaints of pain  Initial 1:1 occupational therapy evaluation completed. Focused skilled intervention on bed mobility, functional use of bilateral UEs, UB/LB bathing, UB/LB dressing, sit/stands, overall activity tolerance/endurance, family ed and training on rehab and recovery post-CVA. Left patient seated in bed side chair with call bell & phone within reach.   Georgeanne Nim 10/21/2011, 12:10 PM

## 2011-10-21 NOTE — Progress Notes (Signed)
Physical Therapy Session Note  Patient Details  Name: Shawn Wiley MRN: [id number] Date of Birth: Oct 06, 1935  Today's Date: 10/21/2011 Time: 1000-1100 Time Calculation (min): 60 min  Short Term Goals: Week 1:  PT Short Term Goal 1 (Week 1): Patient will be able to perform Bed mobility with Mod-Assist. PT Short Term Goal 2 (Week 1): Patient will be able to perform Transfers with Mod-Assist. PT Short Term Goal 3 (Week 1): Patient will be able to propel manual w/c 150' in controlled and household environments with min-Assist. PT Short Term Goal 4 (Week 1): Patient will be able to ascend/descend 2 steps with Max-Assist. PT Short Term Goal 5 (Week 1): Patient will be able to ambulate 51' using LRAD with Max-Assist.  Skilled Therapeutic Interventions/Progress Updates:  Ambulation/gait training;Balance/vestibular training;Discharge planning;DME/adaptive equipment instruction;Functional mobility training;Neuromuscular re-education;Patient/family education;Splinting/orthotics;Stair training;Therapeutic Activities;Therapeutic Exercise;UE/LE Strength taining/ROM;UE/LE Coordination activities   Therapy Documentation Precautions:  Precautions Precautions: Fall Restrictions Weight Bearing Restrictions: No Pain: Pain Assessment Pain Assessment: No/denies pain  Therapeutic Exercise:(15') Supine manual stretching of L ankle into dorsiflexion, Rhythmic Initiation Left LE into hip/knee flexion and extension.  Linearly resisted hip/knee extensions. Therapeutic Activity:(15') Bed mobility roll left Moderate Assist, supine to sit via left side lying Max-Assist, scoot to [facility] Moderate Assist with patient using headboard to assist with scooting.                                           Static and dynamic sitting balance at EOB with B feet supported and Right hand on bed with S/min-Assist                                           Transfer Training: bed to w/c with Max-Assist for stand-pivot transfer  See  FIM for current functional status  Therapy/Group: Individual Therapy  Rex Kras 10/21/2011, 4:40 PM

## 2011-10-21 NOTE — Evaluation (Signed)
Speech Language Pathology Assessment and Plan  Patient Details  Name: Shawn Wiley MRN: [id number] Date of Birth: 18-May-1935  SLP Diagnosis: Dysphagia;Cognitive Impairments;Dysarthria  Rehab Potential: Excellent ELOS: 2-3 weeks   Today's Date: 10/21/2011 Time: 1000-1100 Time Calculation (min): 60 min  Skilled Therapeutic Intervention: Administered BSE and cognitive-linguistic evaluation. Please see below for details.   Problem List:  Patient Active Problem List  Diagnosis  . CVA (cerebral infarction)   Past Medical History:  Past Medical History  Diagnosis Date  . Coronary artery disease   . Myocardial infarction   . Stroke   . GERD (gastroesophageal reflux disease)   . Hypertension    Past Surgical History:  Past Surgical History  Procedure Date  . Hernia repair 2012  . Cholecystectomy 2012  . Colon surgery 2008  . Cardiac catheterization 2013    Assessment / Plan / Recommendation Clinical Impression  Patient was admitted to CIR 10/21/11 secondary to deconditioning after CVA/myocardial infarction and presents with mild oral-pharyngeal dysphagia, mild dysarthria and mild cognitive impairments. Mild oral-pharyngeal dysphagia characterized by delayed swallow initiation of thin liquids with suspected penetration with thin liquids resulting in throat clearing.  Pt demonstrated a timelier swallow without overt s/s of aspiration with nectar thick liquids. Recommend nectar-thick liquids with Dys. 3 textures per pt request due to dentures. Pt also presents with mild dysarthria characterized by decreased intelligibility at the sentence level due to an increased rate and low vocal intensity that requires cueing for pt to self-monitor and correct. Pt's cognitive impairments are characterized by decreased emergent awareness, problem solving and self-monitoring and correcting impacting pt's overall safety and independence. Pt would benefit from skilled SLP intervention to maximize swallow  safety with least restrictive diet, maximize speech intelligibility at the conversation level and maximize cognitive function and overall independence for discharge home.      SLP Assessment  Patient will need skilled Speech Lanaguage Pathology Services during CIR admission    Recommendations  Follow up Recommendations:  (TBD) Equipment Recommended: None recommended by SLP    SLP Frequency 1-2 X/day, 30-60 minutes   SLP Treatment/Interventions Cognitive remediation/compensation;Dysphagia/aspiration precaution training;Internal/external aids;Speech/Language facilitation;Multimodal communication approach;Environmental controls;Cueing hierarchy;Functional tasks;Patient/family education;Therapeutic Activities    Pain Pain Assessment Pain Assessment: No/denies pain Prior Functioning Cognitive/Linguistic Baseline: Within functional limits  Short Term Goals: Week 1: SLP Short Term Goal 1 (Week 1): Pt will demosntrate funcitonal problem solving for basic tasks with supervision verbal and questioning cues.  SLP Short Term Goal 2 (Week 1): Pt will utilize speech intelligbility strategies at the conversation level with supervision verbal and questioning cues.  SLP Short Term Goal 3 (Week 1): Pt will consume trials of thin liquids without overt s/s of aspiraiton with Min verbal cueing.   See FIM for current functional status Refer to Care Plan for Long Term Goals  Recommendations for other services: None  Discharge Criteria: Patient will be discharged from SLP if patient refuses treatment 3 consecutive times without medical reason, if treatment goals not met, if there is a change in medical status, if patient makes no progress towards goals or if patient is discharged from hospital.  The above assessment, treatment plan, treatment alternatives and goals were discussed and mutually agreed upon: by patient and by family  Maelyn Berrey 10/21/2011, 3:35 PM

## 2011-10-22 NOTE — Progress Notes (Signed)
76 year old right-handed male with previous subtotal colectomy, ileostomy and cholecystectomy. Admitted to [facility] 10/09/2011 with abdominal pain. Patient states that on 10/06/2011 (having abdominal discomfort in the right upper quadrant as well as left upper quadrant. He stated that his ileostomy is working regularly. Patient became jaundice as well his mild elevation in liver function studies. Noted bilirubin of 4.1. Patient plan was for ERCP per Dr. Derenda Fennel. During procedure CODE BLUE was called as patient developed acute onset of respiratory distress and hypoxia with bradycardia. Initial heart rates were in the 40s and oxygen saturations in the 70s. EKG showed ST segment elevation myocardial infarction, he was transferred to intensive care unit. Noted by ERCP felt to be obstructive jaundice. He also undergone endoscopic retrograde cholangiopancreatography that was unremarkable. Cardiology was consulted and echocardiogram completed that showed mild concentric left ventricular hypertrophy with ejection fraction 65%. Patient with noted left-sided weakness and slurred speech after ERCP. MRI of the brain June 13 showed restricted diffusion in the right frontal and parietal cortex compatible with acute or subacute infarct. No hemorrhage or mass noted. CTA of the chest showed no pulmonary embolism. Carotid Doppler showed calcified plaque bilaterally in the carotid bulbs around 50-69% stenosis suspected bilaterally. Patient was placed on aspirin therapy as well as subcutaneous Lovenox for DVT prophylaxis. Pertaining to his obstructive jaundice he had been placed on Zosyn and changed to Ceftin which was felt to be well for any cholecystitis type problems and patient remained afebrile. Therapies were initiated with slow progress. Patient noted left hemiplegia with neglect which limited his overall mobility. He was transferred to [facility] 10/18/2011 for heart catheterization after  acute non-ST segment elevation myocardial infarction with cardiac cath unremarkable and advised medical regimen of continuing aspirin but his subcutaneous Lovenox was discontinued at that time and placed on Xarelto for paroxysmal atrial flutter.   Subjective: Patient has no specific complaints today.  BP 108/65  Pulse 69  Temp 98.5 F (36.9 C) (Oral)  Resp 18  Ht 5\' 10"  (1.778 m)  Wt 165 lb 9.1 oz (75.1 kg)  BMI 23.76 kg/m2  SpO2 94%  Elderly male in no acute distress. Neck supple. Chest clear to auscultation cardiac exam S1-S2 are regular. Abdominal exam active bowel sounds, soft.  Post Admission Physician Evaluation:  1. Functional deficits secondary to right frontal-parietal infarct with left hemiparesis. 2. Patient is admitted to receive collaborative, interdisciplinary care between the physiatrist, rehab nursing staff, and therapy team. 3. Patient's level of medical complexity and substantial therapy needs in context of that medical necessity cannot be provided at a lesser intensity of care such as a SNF. 4. Patient has experienced substantial functional loss from his/her baseline which was documented above under the "Functional History" and "Functional Status" headings. Judging by the patient's diagnosis, physical exam, and functional history, the patient has potential for functional progress which will result in measurable gains while on inpatient rehab. These gains will be of substantial and practical use upon discharge in facilitating mobility and self-care at the household level. 5. Physiatrist will provide 24 hour management of medical needs as well as oversight of the therapy plan/treatment and provide guidance as appropriate regarding the interaction of the two. 6. 24 hour rehab nursing will assist with bladder management, bowel management, safety, skin/wound care, disease management, medication administration, pain management and patient education and help integrate therapy  concepts, techniques,education, etc. 7. PT will assess and treat for: lower extremity strength, NMR, visuospatial awareness, safety, tone mgt, adaptive equipment,  functional mobility and family ed. Goals are: minimal assist. 8. OT will assess and treat for: UES, ROM, ADL's, fxnl mobiltiy, visuospatial awareness,adaptive equipment, NMR, tone mgt, family ed. Goals are: minimal assist. 9. SLP will assess and treat for: cognition, communication, swallowing. Goals are: supervision to min assist. 10. Case Management and Social Worker will assess and treat for psychological issues and discharge planning. 11. Team conference will be held weekly to assess progress toward goals and to determine barriers to discharge. 12. Patient will receive at least 3 hours of therapy per day at least 5 days per week. 13. ELOS: 3 weeks Prognosis: good Medical Problem List and Plan:  1. Right frontal parietal CVA after ERCP  2. DVT Prophylaxis/Anticoagulation: SCDs. Monitor for any signs of DVT.  3. Acute non-ST segment elevation myocardial infarction. Cardiac cath unremarkable. No complaints of chest pain or shortness of breath. 4. Obstructive jaundice/history ileostomy. Monitor LFTs and routine care of ileostomy. Continue Protonix  -pt,wife had been independent with ostomy care PTA  -serial labs  5. Hypertension. Lisinopril 10 mg daily and Lopressor 25 mg twice a day. Monitor with increased mobility  BP Readings from Last 3 Encounters:  10/22/11 108/65    6. PAF. Xarelto 20 mg daily. Cardiac rate controlled  6. Hyperlipidemia. Crestor  7. Dysphagia. Currently on dysphagia 3 nectar liquids. We'll have speech therapy followup  8. Tone: ROM, splinting- consider medication if resistant to conservative measures

## 2011-10-22 NOTE — Progress Notes (Signed)
Occupational Therapy Session Note  Patient Details  Name: Shawn Wiley MRN: [id number] Date of Birth: May 27, 1935  Today's Date: 10/22/2011 Time: 0900-1005 Time Calculation (min): 65 min  Skilled Therapeutic Interventions/Progress Updates: ADL on xtra wife 3:1 with drop arm with focus on attention, left side attention (required 3 prompts to locate hand and put in  Safe place); L UE and LE neuro reducation; standing balance and posture.  Wife and [name] cleaned colostomy bag together    Therapy Documentation Precautions:  Precautions Precautions: Fall Restrictions Weight Bearing Restrictions: No  Pain: no c/os   See FIM for current functional status  Therapy/Group: Individual Therapy  Bud Face [facility] 10/22/2011, 3:57 PM

## 2011-10-23 LAB — COMPREHENSIVE METABOLIC PANEL
ALT: 55 U/L — ABNORMAL HIGH (ref 0–53)
Alkaline Phosphatase: 161 U/L — ABNORMAL HIGH (ref 39–117)
BUN: 16 mg/dL (ref 6–23)
CO2: 26 mEq/L (ref 19–32)
Chloride: 93 mEq/L — ABNORMAL LOW (ref 96–112)
GFR calc Af Amer: >90 mL/min (ref >90)
GFR calc non Af Amer: 83 mL/min — ABNORMAL LOW (ref >90)
Glucose, Bld: 99 mg/dL (ref 70–99)
Potassium: 4.7 mEq/L (ref 3.5–5.1)
Sodium: 129 mEq/L — ABNORMAL LOW (ref 135–145)
Total Bilirubin: 1.7 mg/dL — ABNORMAL HIGH (ref 0.3–1.2)

## 2011-10-23 LAB — DIFFERENTIAL
Basophils Relative: 0 % (ref 0–1)
Eosinophils Absolute: 0.3 10*3/uL (ref 0.0–0.7)
Eosinophils Relative: 3 % (ref 0–5)
Lymphs Abs: 2.6 10*3/uL (ref 0.7–4.0)
Neutrophils Relative %: 63 % (ref 43–77)

## 2011-10-23 LAB — CBC
HCT: 35.7 % — ABNORMAL LOW (ref 39.0–52.0)
Hemoglobin: 12.1 g/dL — ABNORMAL LOW (ref 13.0–17.0)
RBC: 3.99 MIL/uL — ABNORMAL LOW (ref 4.22–5.81)

## 2011-10-23 MED ORDER — BOOST / RESOURCE BREEZE PO LIQD
1.0000 | ORAL | Status: DC
  Administered 2011-10-23: 1 via ORAL

## 2011-10-23 MED ORDER — ATORVASTATIN CALCIUM 20 MG PO TABS
20.0000 mg | ORAL_TABLET | Freq: Every day | ORAL | Status: DC
  Administered 2011-10-23 – 2011-11-09 (×18): 20 mg via ORAL
  Filled 2011-10-23 (×21): qty 1

## 2011-10-23 NOTE — Progress Notes (Signed)
Physical Therapy Note  Patient Details  Name: Shawn Wiley MRN: [id number] Date of Birth: Feb 06, 1936 Today's Date: 10/23/2011  13:00-14:00 individual therapy pt denied pain.  Performed stand pivot with max assist with decreased weight in left leg. Transitioned to squat pivot with mod assist to approximate left knee and for facilitation for maintaining forward weight shift. Performed trunk strengthening and weight bearing through left elbow focusing on rotation to decrease tone. Bed mobility initially max assist progressing to mod assist with tactile and visual cues for normal movement with rolling. performed supine bridging with rotation and lumbar rolls x 15 each.   Julian Reil 10/23/2011, 2:00 PM

## 2011-10-23 NOTE — Progress Notes (Signed)
Physical Therapy Note  Patient Details  Name: Shawn Wiley MRN: [id number] Date of Birth: 08-08-1935 Today's Date: 10/23/2011  14:20-15:05 individual therapy pt denied pain.  Pt demonstrated carryover with techniques taught for transfer and bed mobility from previous session with question cues, squat transfers min assist with approximation to left knee with active contractions and facilitation for maintaining anterior weight shift. Pt progressed to ability to maintain trunk control with transfer. Pt required mod assist for sit to side. Gait x 40' with initial 3 musketeer using toe off AFO +2 pt =50% then used eva walker with 2 person assist for facilitating decreased trunk rotation and other person assisting with foot placement. vc and facilitation for hip coming forward over toe and approximation to break tone and to get heel to contact floor. 4 steps with 1 rail and 2 person assist with AFO with assist to place left foot and to prevent severe scissoring.   Julian Reil 10/23/2011, 4:18 PM

## 2011-10-23 NOTE — Progress Notes (Signed)
Occupational Therapy Session Note  Patient Details  Name: Shawn Wiley MRN: [id number] Date of Birth: 29-Oct-1935  Today's Date: 10/23/2011 Time: 0910-1025 Time Calculation (min): 75 min  Short Term Goals: Week 1:  OT Short Term Goal 1 (Week 1): Patient will complete transfers functional transfers with min assist OT Short Term Goal 2 (Week 1): Patient will complete upper body bathing with supervision and setup OT Short Term Goal 3 (Week 1): Patient will complete lower body bathing with min assist OT Short Term Goal 4 (Week 1): Patient will complete upper body dressing with min assist OT Short Term Goal 5 (Week 1): Patient will complete lower body dressing with moderate assist  Skilled Therapeutic Interventions/Progress Updates:  Self care retraining to include shower, dress and groom.  Focus session on safe bed>w/c><shower bench transfers, patient to slow down and focus on task, patient learning the management of LUE and LLE with all mobility and self care tasks, hemi techniques for bath and dress, attend to left side of body, heavy weight bearing onto LLE, hand over hand for LUE to attempt to assist with bath.  When patient attempts to activate left side of body, his entire body responds resulting in abnormal spastice tone (extensor and inversion in LE, flexor/extensor in UE, shoulder elevates and trunk rotates.  Precautions:  Precautions Precautions: Fall Restrictions Weight Bearing Restrictions: No  Pain: Pain Assessment Pain Assessment: 0-10 Pain Score: 0-No pain Faces Pain Scale: No hurt  See FIM for current functional status  Therapy/Group: Individual Therapy.  Wife also present and supportive for entire session.  Wife in need of a right knee replacement and cancelled upcoming surgery due to husband's CVA.  Taquila Leys 10/23/2011, 11:43 AM

## 2011-10-23 NOTE — Progress Notes (Signed)
Per State Regulation 482.30 This chart was reviewed for medical necessity with respect to the patient's Admission/Duration of stay. Pt participating in therapies. D-II diet, thin liquids with supervision. Meryl Dare                 Nurse Care Manager            Next Review Date: 10/26/11

## 2011-10-23 NOTE — Progress Notes (Signed)
Patient information reviewed and entered into UDS-PRO system by Mattingly Fountaine, RN, CRRN, PPS Coordinator.  Information including medical coding and functional independence measure will be reviewed and updated through discharge.     Per nursing patient was given "Data Collection Information Summary for Patients in Inpatient Rehabilitation Facilities with attached "Privacy Act Statement-Health Care Records" upon admission.   

## 2011-10-23 NOTE — Progress Notes (Signed)
Patient displayed unsafe behaviors in room alone; impulsive behavior and poor cognition at times.  Pt. Brought to nurse's station after dinner approx. 1630 for supervision.  Pt. Requested to go back to bed at 1800; bed alarm set and call bell in reach.  Continuing to monitor.

## 2011-10-23 NOTE — Progress Notes (Signed)
Social Work  Social Work Assessment and Plan  Patient Details  Name: Shawn Wiley MRN: [id number] Date of Birth: 1935-05-05  Today's Date: 10/23/2011  Problem List:  Patient Active Problem List  Diagnosis  . CVA (cerebral infarction)   Past Medical History:  Past Medical History  Diagnosis Date  . Coronary artery disease   . Myocardial infarction   . Stroke   . GERD (gastroesophageal reflux disease)   . Hypertension    Past Surgical History:  Past Surgical History  Procedure Date  . Hernia repair 2012  . Cholecystectomy 2012  . Colon surgery 2008  . Cardiac catheterization 2013   Social History:  reports that he has quit smoking. He does not have any smokeless tobacco history on file. He reports that he does not drink alcohol or use illicit drugs.  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   Caregiver Availability: 24/7 Family Dynamics: Pt describes very supportive family with most living very close by to pt and his wife.  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 Focused on goals of "being able to play the guitar again" and getting back to an active lifestyle.  Denies any s/s of depression or ansxiety - "Nope, I feel like I'm doing pretty well".  Depression screen not indicated at this time but will monitor emotional status throughout stay. Recent Psychosocial Issues: None Pyschiatric History: None Substance Abuse History: None  Patient / Family Perceptions, Expectations & Goals Pt/Family understanding of illness & functional limitations: Pt able to offer fairly detailed report of multiple medical issues leading to his admit, tests performed and final determination he had suffered a stroke.  Good understanding of his current functional limitations as well. Premorbid pt/family roles/activities: Pt notes he and wife frequently go to the local senior citizens "gym"  - enjoying walking and riding the bikes.  Also enjoys playing the guitar and working with his church choir. Anticipated changes in roles/activities/participation: Pt may be limited primarily due to upper extremity impairments and wife may need to assume some caregiver duties. Pt/family expectations/goals: "I want to be able to play the guitar...get to our house at [employer]"  [employer] Agencies: None Premorbid Home Care/DME Agencies: None Transportation available at discharge: yes Resource referrals recommended: Support group (specify) (Stroke Support Group)  Discharge Planning Living Arrangements: Spouse/significant other Support Systems: Spouse/significant other;Children;Other relatives;Church/faith community;Friends/neighbors Type of Residence: Private residence Insurance  Resources: [employer] (specify) Herbalist) Financial Resources: Social Security (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  He is motivated to eventually be able to regain social activities, travel and leading his church choir.  Good family support and availability of 24/7 assist.  No s/s of any emotional distress but will monitor through stay.  Megan Salon, Maniya Donovan 10/23/2011, 4:23 PM

## 2011-10-23 NOTE — Progress Notes (Signed)
INITIAL ADULT NUTRITION ASSESSMENT Date: 10/23/2011   Time: 9:56 AM  Reason for Assessment: Nutrition Risk Report  ASSESSMENT: Male 76 y.o.  Dx: CVA (cerebral infarction)  Hx:  Past Medical History  Diagnosis Date  . Coronary artery disease   . Myocardial infarction   . Stroke   . GERD (gastroesophageal reflux disease)   . Hypertension    Past Surgical History  Procedure Date  . Hernia repair 2012  . Cholecystectomy 2012  . Colon surgery 2008  . Cardiac catheterization 2013   Related Meds:     . aspirin EC  81 mg Oral Daily  . B-complex with vitamin C  1 tablet Oral Daily  . cefUROXime  500 mg Oral BID WC  . lisinopril  10 mg Oral Daily  . metoprolol tartrate  25 mg Oral BID  . multivitamins with iron  1 tablet Oral Daily  . pantoprazole  40 mg Oral Daily  . rivaroxaban  20 mg Oral QPC supper   Ht: 5\' 10"  (177.8 cm)  Wt: 165 lb 9.1 oz (75.1 kg)  Ideal Wt: 75.5 kg % Ideal Wt: 99%  Usual Wt: 172 - 173 lb prior to hospitalization % Usual Wt: 96%  Body mass index is 23.76 kg/(m^2). Weight is WNL  Food/Nutrition Related Hx: Regular diet prior to hospital admission, pt with colostomy and tries to consume a yogurt and Gatorade daily  Labs:  CMP     Component Value Date/Time   NA 129* 10/23/2011 0600   K 4.7 10/23/2011 0600   CL 93* 10/23/2011 0600   CO2 26 10/23/2011 0600   GLUCOSE 99 10/23/2011 0600   BUN 16 10/23/2011 0600   CREATININE 0.88 10/23/2011 0600   CALCIUM 10.0 10/23/2011 0600   PROT 6.9 10/23/2011 0600   ALBUMIN 3.3* 10/23/2011 0600   AST 42* 10/23/2011 0600   ALT 55* 10/23/2011 0600   ALKPHOS 161* 10/23/2011 0600   BILITOT 1.7* 10/23/2011 0600   GFRNONAA 83* 10/23/2011 0600   GFRAA >90 10/23/2011 0600    Intake/Output Summary (Last 24 hours) at 10/23/11 0958 Last data filed at 10/23/11 0815  Gross per 24 hour  Intake    360 ml  Output   1375 ml  Net  -1015 ml   Diet Order: Dysphagia 3 with Nectar Thick liquids  Supplements/Tube Feeding: MVI  and B-complex with vitamin C  IVF:    Estimated Nutritional Needs:   Kcal:  1785 - 2000 kcal Protein:  82 - 98 grams protein Fluid:  at least 2.2 liters daily  Admitted to [facility] on 6/10 with abdominal pain. Pt had STEMI and was transferred to ICU. Work-up revealed obstructive jaundice. MRI of the brain June 13 showed restricted diffusion in the right frontal and parietal cortex compatible with acute or subacute infarct  Transferred to [facility] on 6/19 for heart catheterization.  Intake of meals is currently 75%. No skin breakdown at this time. Pt states usual weight is 172 - 173 lb. Intake is optimal at this time.   NUTRITION DIAGNOSIS: -Increased nutrient needs (NI-5.1).  Status: Ongoing  RELATED TO: participation in rehab activities  AS EVIDENCE BY: estimated needs  MONITORING/EVALUATION(Goals): Goal: Pt to consume at least 90% of estimated needs Monitor: weights, labs, PO intake, I/O's  EDUCATION NEEDS: -No education needs identified at this time  INTERVENTION: 1. Resource Breeze PO daily to help meet fluid needs 2. Offer Magic Cup PO BID 3. RD to continue current nutrition care plan  Dietitian #:  [phone]  DOCUMENTATION CODES Per approved criteria  -Not Applicable   Adair Laundry 10/23/2011, 9:56 AM

## 2011-10-23 NOTE — Progress Notes (Signed)
Patient ID: Shawn Wiley, male   DOB: [birth year], 76 y.o.   MRN: [id number]  Subjective/Complaints: Coughing last noc.  Moving L side better Review of Systems  Respiratory: Positive for cough and sputum production. Negative for hemoptysis, shortness of breath and wheezing.   All other systems reviewed and are negative.   Objective: Vital Signs: Blood pressure 100/62, pulse 72, temperature 98.7 F (37.1 C), temperature source Oral, resp. rate 16, height 5\' 10"  (1.778 m), weight 75.1 kg (165 lb 9.1 oz), SpO2 95.00%. No results found. Results for orders placed during the hospital encounter of 10/20/11 (from the past 72 hour(s))  CBC     Status: Abnormal   Collection Time   10/23/11  6:00 AM      Component Value Range Comment   WBC 12.0 (*) 4.0 - 10.5 K/uL    RBC 3.99 (*) 4.22 - 5.81 MIL/uL    Hemoglobin 12.1 (*) 13.0 - 17.0 g/dL    HCT 11.9 (*) 14.7 - 52.0 %    MCV 89.5  78.0 - 100.0 fL    MCH 30.3  26.0 - 34.0 pg    MCHC 33.9  30.0 - 36.0 g/dL    RDW 82.9  56.2 - 13.0 %    Platelets 610 (*) 150 - 400 K/uL   COMPREHENSIVE METABOLIC PANEL     Status: Abnormal   Collection Time   10/23/11  6:00 AM      Component Value Range Comment   Sodium 129 (*) 135 - 145 mEq/L    Potassium 4.7  3.5 - 5.1 mEq/L    Chloride 93 (*) 96 - 112 mEq/L    CO2 26  19 - 32 mEq/L    Glucose, Bld 99  70 - 99 mg/dL    BUN 16  6 - 23 mg/dL    Creatinine, Ser 8.65  0.50 - 1.35 mg/dL    Calcium 78.4  8.4 - 10.5 mg/dL    Total Protein 6.9  6.0 - 8.3 g/dL    Albumin 3.3 (*) 3.5 - 5.2 g/dL    AST 42 (*) 0 - 37 U/L    ALT 55 (*) 0 - 53 U/L    Alkaline Phosphatase 161 (*) 39 - 117 U/L    Total Bilirubin 1.7 (*) 0.3 - 1.2 mg/dL    GFR calc non Af Amer 83 (*) >90 mL/min    GFR calc Af Amer >90  >90 mL/min   DIFFERENTIAL     Status: Abnormal   Collection Time   10/23/11  6:00 AM      Component Value Range Comment   Neutrophils Relative 63  43 - 77 %    Neutro Abs 7.6  1.7 - 7.7 K/uL    Lymphocytes Relative  22  12 - 46 %    Lymphs Abs 2.6  0.7 - 4.0 K/uL    Monocytes Relative 12  3 - 12 %    Monocytes Absolute 1.4 (*) 0.1 - 1.0 K/uL    Eosinophils Relative 3  0 - 5 %    Eosinophils Absolute 0.3  0.0 - 0.7 K/uL    Basophils Relative 0  0 - 1 %    Basophils Absolute 0.1  0.0 - 0.1 K/uL      HEENT: normal Cardio: RRR Resp: CTA B/L GI: BS positive and RLQ colostomy Extremity:  No Edema Skin:   Intact Neuro: Alert/Oriented, Abnormal Sensory tingling with Lt touch LUE and LLE and Abnormal Motor 3-/5 with  flexor synergy LUE, 2-/5 with ext synergy LLE except 0/5 L ankle Musc/Skel:  Normal   Assessment/Plan: 1. Functional deficits secondary to R frontal and pariental infarct as well as decondtioning which require 3+ hours per day of interdisciplinary therapy in a comprehensive inpatient rehab setting. Physiatrist is providing close team supervision and 24 hour management of active medical problems listed below. Physiatrist and rehab team continue to assess barriers to discharge/monitor patient progress toward functional and medical goals. FIM: FIM - Bathing Bathing Steps Patient Completed: Chest;Abdomen;Front perineal area;Right upper leg;Left upper leg;Right lower leg (including foot) Bathing: 3: Mod-Patient completes 5-7 75f 10 parts or 50-74%  FIM - Upper Body Dressing/Undressing Upper body dressing/undressing steps patient completed: Thread/unthread right sleeve of pullover shirt/dresss;Put head through opening of pull over shirt/dress;Pull shirt over trunk Upper body dressing/undressing: 4: Min-Patient completed 75 plus % of tasks FIM - Lower Body Dressing/Undressing Lower body dressing/undressing steps patient completed: Thread/unthread right underwear leg;Thread/unthread right pants leg Lower body dressing/undressing: 2: Max-Patient completed 25-49% of tasks (patient completed approx 25% of LB dressing)  FIM - Toileting Toileting Assistive Devices: Toilet  Aid/prosthesis/orthosis Toileting: 0: Activity did not occur  FIM - Diplomatic Services operational officer Devices: Grab bars Toilet Transfers: 0-Activity did not occur  FIM - Banker Devices: Bed rails;Arm rests;HOB elevated Bed/Chair Transfer: 3: Chair or W/C > Bed: Mod A (lift or lower assist);3: Bed > Chair or W/C: Mod A (lift or lower assist);3: Sit > Supine: Mod A (lifting assist/Pt. 50-74%/lift 2 legs);3: Supine > Sit: Mod A (lifting assist/Pt. 50-74%/lift 2 legs  FIM - Locomotion: Wheelchair Locomotion: Wheelchair: 0: Activity did not occur FIM - Locomotion: Ambulation Ambulation/Gait Assistance: 2: Max assist  Comprehension Comprehension Mode: Auditory Comprehension: 5-Follows basic conversation/direction: With no assist  Expression Expression Mode: Verbal Expression: 5-Expresses basic needs/ideas: With extra time/assistive device  Social Interaction Social Interaction: 6-Interacts appropriately with others with medication or extra time (anti-anxiety, antidepressant).  Problem Solving Problem Solving: 5-Solves basic 90% of the time/requires cueing < 10% of the time  Memory Memory: 5-Recognizes or recalls 90% of the time/requires cueing < 10% of the time  Medical Problem List and Plan:  1. Right frontal parietal CVA after ERCP  2. DVT Prophylaxis/Anticoagulation: SCDs. Monitor for any signs of DVT.  3. Acute non-ST segment elevation myocardial infarction. Cardiac cath unremarkable. No complaints of chest pain or shortness of breath. Continue medical management with aspirin and Xarelto  4. Obstructive jaundice/history ileostomy. Monitor LFTs and routine care of ileostomy. Continue Protonix  -pt,wife had been independent with ostomy care PTA  -serial labs  5. Hypertension. Lisinopril 10 mg daily and Lopressor 25 mg twice a day. Monitor with increased mobility hild lisinopril today for BP 100 systolic 6. PAF. Xarelto 20 mg  daily. Cardiac rate controlled  6. Hyperlipidemia. Crestor  7. Dysphagia. Currently on dysphagia 3 nectar liquids. We'll have speech therapy followup  8. Tone: ROM, splinting- consider medication if resistant to conservative measures     LOS (Days) 3 A FACE TO FACE EVALUATION WAS PERFORMED  Deanta Mincey E 10/23/2011, 8:14 AM

## 2011-10-23 NOTE — Progress Notes (Signed)
[facility] Individual Statement of Services  Patient Name:  Shawn Wiley  Date:  10/23/2011  Welcome to the [facility].  Our goal is to provide you with an individualized program based on your diagnosis and situation, designed to meet your specific needs.  With this comprehensive rehabilitation program, you will be expected to participate in at least 3 hours of rehabilitation therapies Monday-Friday, with modified therapy programming on the weekends.  Your rehabilitation program will include the following services:  Physical Therapy (PT), Occupational Therapy (OT), Speech Therapy (ST), 24 hour per day rehabilitation nursing, Therapeutic Recreaction (TR), Case Management (RN and Child psychotherapist), Rehabilitation Medicine, Nutrition Services and Pharmacy Services  Weekly team conferences will be held on Wednesdays to discuss your progress.  Your RN Case Designer, television/film set will talk with you frequently to get your input and to update you on team discussions.  Team conferences with you and your family in attendance may also be held.  Expected length of stay: 2-3 weeks    Overall anticipated outcome: Minimum assistance with mobility, modified independent with ADLs  Depending on your progress and recovery, your program may change.  Your RN Case Estate agent will coordinate services and will keep you informed of any changes.  Your RN Sports coach and SW names and contact numbers are listed  below.  The following services may also be recommended but are not provided by the [facility]:   Driving Evaluations  Home Health Rehabiltiation Services  Outpatient Rehabilitatation [facility]  Vocational Rehabilitation   Arrangements will be made to provide these services after discharge if needed.  Arrangements include referral to agencies that provide these services.  Your insurance has been verified to be:  Medicare + BCBS  of [place] Your primary doctor is:  Dr Foye Deer  Pertinent information will be shared with your doctor and your insurance company.  Case Manager: Lutricia Horsfall, [facility] [phone]  Social Worker:  Dossie Der, [place] [phone]  Information discussed with and copy given to patient by: Meryl Dare, 10/23/2011

## 2011-10-23 NOTE — Progress Notes (Signed)
Speech Language Pathology Daily Session Note  Patient Details  Name: Shawn Wiley MRN: [id number] Date of Birth: 19-Aug-1935  Today's Date: 10/23/2011 Time: 1035-1130 Time Calculation (min): 55 min  Short Term Goals: Week 1: SLP Short Term Goal 1 (Week 1): Pt will demosntrate funcitonal problem solving for basic tasks with supervision verbal and questioning cues.  SLP Short Term Goal 2 (Week 1): Pt will utilize speech intelligbility strategies at the conversation level with supervision verbal and questioning cues.  SLP Short Term Goal 3 (Week 1): Pt will consume trials of thin liquids without overt s/s of aspiraiton with Min verbal cueing.   Skilled Therapeutic Interventions: SLP facilciated session by educating patient and wife on current goals.  Treatment focused on dysphagia and communication goals.   SLP facilcaited session by preseting patient with water via cups patient conumed 10oz with throat clear x2 and 4oz via straw with throat clear x1.  Per patient request diet downgraded to Dys.2 with report of prolonged mastication, wife confirms.  SLP also faciliated session by educating patient on effective strategies to use to facilcaite improved speech intelligibility.  However, patient intelligible at conversational level with cues x1 to repeat.     FIM:  Comprehension Comprehension Mode: Auditory Comprehension: 4-Understands basic 75 - 89% of the time/requires cueing 10 - 24% of the time Expression Expression Mode: Verbal Expression: 5-Expresses complex 90% of the time/cues < 10% of the time Social Interaction Social Interaction: 5-Interacts appropriately 90% of the time - Needs monitoring or encouragement for participation or interaction. Problem Solving Problem Solving: 3-Solves basic 50 - 74% of the time/requires cueing 25 - 49% of the time Memory Memory: 4-Recognizes or recalls 75 - 89% of the time/requires cueing 10 - 24% of the time FIM - Eating Eating Activity: 5: Set-up  assist for cut food;5: Set-up assist for open containers  Pain Pain Assessment Pain Assessment: No/denies pain Pain Score: 0-No pain Faces Pain Scale: No hurt  Therapy/Group: Individual Therapy  Charlane Ferretti., CCC-SLP [phone]  Leanthony Rhett 10/23/2011, 1:21 PM

## 2011-10-24 DIAGNOSIS — I6330 Cerebral infarction due to thrombosis of unspecified cerebral artery: Secondary | ICD-10-CM

## 2011-10-24 DIAGNOSIS — Z5189 Encounter for other specified aftercare: Secondary | ICD-10-CM

## 2011-10-24 NOTE — Progress Notes (Signed)
Physical Therapy Session Note  Patient Details  Name: Shawn Wiley MRN: [id number] Date of Birth: December 29, 1935  Today's Date: 10/24/2011 Time:1302-1450 Time Calculation (min):48 min  Short Term Goals: Week 1:  PT Short Term Goal 1 (Week 1): Patient will be able to perform Bed mobility with Mod-Assist. PT Short Term Goal 2 (Week 1): Patient will be able to perform Transfers with Mod-Assist. PT Short Term Goal 3 (Week 1): Patient will be able to propel manual w/c 150' in controlled and household environments with min-Assist. PT Short Term Goal 4 (Week 1): Patient will be able to ascend/descend 2 steps with Max-Assist. PT Short Term Goal 5 (Week 1): Patient will be able to ambulate 69' using LRAD with Max-Assist.  Skilled Therapeutic Interventions/Progress Updates: focus on neuromuscular re-education LLE via manual and cues, demo, forced use; mobility.   W/c mobility using hemi method with occasional min assist for steering, x 70' before c/o RLE fatigue.  W/c> mat squat pivot transfer to R with min assist  Neuromuscular re-education trunk and pelvis for trunk shortening/lenthening to facilitate head and trunk righting responses in sitting and standing.  Bed mobility: sit: supine with supervision, rolling >R > sitting with 1 VC for LUE, rolling L with supervision.  LLE neuromuscular re-education for pelvic depression/hip extension, in sidelying, against manual resistance, 10 x 1.    Therapeutic activity of reaching to limits of stability L with RUE, L forearm wt bearing and pushing up slightly with LUE, x 8 with LOB L.  Pre-gait activity in standing at steps using R rail, working on L hip and knee flexion to clear L foot, ACE on L foot.  Pt's extensor tone LLE is significant, limiting his functional movement.    L w/c elevating legrest switched out for regular one; he is able to tolerate it and it may help break up extensor tone.   Therapy Documentation Precautions:   Precautions Precautions: Fall Restrictions Weight Bearing Restrictions: No   Pain: Pain Assessment Pain Assessment: No/denies pain Pain Score: 0-No pain     See FIM for current functional status  Therapy/Group: Individual Therapy  Austin Herd 10/24/2011, 3:02 PM

## 2011-10-24 NOTE — Progress Notes (Signed)
Speech Language Pathology Daily Session Note  Patient Details  Name: Shawn Wiley MRN: [id number] Date of Birth: 02-12-1936  Today's Date: 10/24/2011 Time: 0930-1030 Time Calculation (min): 60 min  Short Term Goals: Week 1: SLP Short Term Goal 1 (Week 1): Pt will demosntrate funcitonal problem solving for basic tasks with supervision verbal and questioning cues.  SLP Short Term Goal 2 (Week 1): Pt will utilize speech intelligbility strategies at the conversation level with supervision verbal and questioning cues.  SLP Short Term Goal 3 (Week 1): Pt will consume trials of thin liquids without overt s/s of aspiraiton with Min verbal cueing.   Skilled Therapeutic Interventions: Treatment session focused on dyspgagia and cognition treatment; SLP facilitated session with minimal assist sematnic cues to follow written directions to complete a basic information form; SLP facilcaited session by providing semantic cues to complete form as well as moderate assist semantic cues to attend to left upper extremity and problem solve safely scooting self back in wheel chair.  SLP also facilcaited sesion by providing patient a current list of medications, he was able to recall them with 10% accuracy prior to written aid after with modified independence.  Patient and wife had questions regarding medications and dosages and SLP notified the PA.  Throughout session paient consumed thin liquids via cup with throat clear x1 with 4-5oz consumed.  Patient and wife report he is tolerating his current diet better.     FIM:  Comprehension Comprehension: 4-Understands basic 75 - 89% of the time/requires cueing 10 - 24% of the time Expression Expression: 5-Expresses complex 90% of the time/cues < 10% of the time Social Interaction Social Interaction: 5-Interacts appropriately 90% of the time - Needs monitoring or encouragement for participation or interaction. Problem Solving Problem Solving: 3-Solves basic 50 - 74% of  the time/requires cueing 25 - 49% of the time Memory Memory: 4-Recognizes or recalls 75 - 89% of the time/requires cueing 10 - 24% of the time FIM - Eating Eating Activity: 5: Set-up assist for cut food;5: Set-up assist for open containers  Pain Pain Assessment Pain Assessment: No/denies pain Pain Score: 0-No pain  Therapy/Group: Individual Therapy  Charlane Ferretti., CCC-SLP [phone]  Shawn Wiley 10/24/2011, 1:43 PM

## 2011-10-24 NOTE — Plan of Care (Signed)
Problem: RH BOWEL ELIMINATION Goal: RH STG MANAGE BOWEL WITH ASSISTANCE STG Manage Bowel with Max Assistance.  Outcome: Progressing Patient assists with colostomy care.

## 2011-10-24 NOTE — Progress Notes (Signed)
Patient ID: Shawn Wiley, male   DOB: [birth year], 76 y.o.   MRN: [id number]  Subjective/Complaints: Coughing last noc.  Moving L side better Review of Systems  Respiratory: Positive for cough and sputum production. Negative for hemoptysis, shortness of breath and wheezing.   All other systems reviewed and are negative.   Objective: Vital Signs: Blood pressure 145/76, pulse 75, temperature 98.4 F (36.9 C), temperature source Oral, resp. rate 17, height 5\' 10"  (1.778 m), weight 78.2 kg (172 lb 6.4 oz), SpO2 95.00%. No results found. Results for orders placed during the hospital encounter of 10/20/11 (from the past 72 hour(s))  CBC     Status: Abnormal   Collection Time   10/23/11  6:00 AM      Component Value Range Comment   WBC 12.0 (*) 4.0 - 10.5 K/uL    RBC 3.99 (*) 4.22 - 5.81 MIL/uL    Hemoglobin 12.1 (*) 13.0 - 17.0 g/dL    HCT 40.9 (*) 81.1 - 52.0 %    MCV 89.5  78.0 - 100.0 fL    MCH 30.3  26.0 - 34.0 pg    MCHC 33.9  30.0 - 36.0 g/dL    RDW 91.4  78.2 - 95.6 %    Platelets 610 (*) 150 - 400 K/uL   COMPREHENSIVE METABOLIC PANEL     Status: Abnormal   Collection Time   10/23/11  6:00 AM      Component Value Range Comment   Sodium 129 (*) 135 - 145 mEq/L    Potassium 4.7  3.5 - 5.1 mEq/L    Chloride 93 (*) 96 - 112 mEq/L    CO2 26  19 - 32 mEq/L    Glucose, Bld 99  70 - 99 mg/dL    BUN 16  6 - 23 mg/dL    Creatinine, Ser 2.13  0.50 - 1.35 mg/dL    Calcium 08.6  8.4 - 10.5 mg/dL    Total Protein 6.9  6.0 - 8.3 g/dL    Albumin 3.3 (*) 3.5 - 5.2 g/dL    AST 42 (*) 0 - 37 U/L    ALT 55 (*) 0 - 53 U/L    Alkaline Phosphatase 161 (*) 39 - 117 U/L    Total Bilirubin 1.7 (*) 0.3 - 1.2 mg/dL    GFR calc non Af Amer 83 (*) >90 mL/min    GFR calc Af Amer >90  >90 mL/min   DIFFERENTIAL     Status: Abnormal   Collection Time   10/23/11  6:00 AM      Component Value Range Comment   Neutrophils Relative 63  43 - 77 %    Neutro Abs 7.6  1.7 - 7.7 K/uL    Lymphocytes Relative  22  12 - 46 %    Lymphs Abs 2.6  0.7 - 4.0 K/uL    Monocytes Relative 12  3 - 12 %    Monocytes Absolute 1.4 (*) 0.1 - 1.0 K/uL    Eosinophils Relative 3  0 - 5 %    Eosinophils Absolute 0.3  0.0 - 0.7 K/uL    Basophils Relative 0  0 - 1 %    Basophils Absolute 0.1  0.0 - 0.1 K/uL      HEENT: normal Cardio: RRR Resp: CTA B/L GI: BS positive and RLQ colostomy Extremity:  No Edema Skin:   Intact Neuro: Alert/Oriented, Abnormal Sensory tingling with Lt touch LUE and LLE and Abnormal Motor 3-/5 with  flexor synergy LUE, 2-/5 with ext synergy LLE except 0/5 L ankle Musc/Skel:  Normal Tone ashworth 3 at triceps, 2 in biceps and finger/wrist flexors  Assessment/Plan: 1. Functional deficits secondary to R frontal and pariental infarct as well as decondtioning which require 3+ hours per day of interdisciplinary therapy in a comprehensive inpatient rehab setting. Physiatrist is providing close team supervision and 24 hour management of active medical problems listed below. Physiatrist and rehab team continue to assess barriers to discharge/monitor patient progress toward functional and medical goals. FIM: FIM - Bathing Bathing Steps Patient Completed: Chest;Abdomen;Front perineal area;Right upper leg;Left upper leg;Right lower leg (including foot);Left Arm Bathing: 3: Mod-Patient completes 5-7 71f 10 parts or 50-74%  FIM - Upper Body Dressing/Undressing Upper body dressing/undressing steps patient completed: Thread/unthread right sleeve of pullover shirt/dresss;Put head through opening of pull over shirt/dress;Pull shirt over trunk Upper body dressing/undressing: 4: Min-Patient completed 75 plus % of tasks FIM - Lower Body Dressing/Undressing Lower body dressing/undressing steps patient completed: Thread/unthread right underwear leg;Thread/unthread right pants leg;Don/Doff right sock;Don/Doff left sock;Don/Doff right shoe;Don/Doff left shoe;Fasten/unfasten right shoe;Fasten/unfasten left  shoe Lower body dressing/undressing: 3: Mod-Patient completed 50-74% of tasks  FIM - Hotel manager Devices: Toilet Aid/prosthesis/orthosis Toileting: 0: Activity did not occur  FIM - Diplomatic Services operational officer Devices: Grab bars Toilet Transfers: 0-Activity did not occur  FIM - Banker Devices: Arm rests Bed/Chair Transfer: 3: Supine > Sit: Mod A (lifting assist/Pt. 50-74%/lift 2 legs;3: Sit > Supine: Mod A (lifting assist/Pt. 50-74%/lift 2 legs);3: Bed > Chair or W/C: Mod A (lift or lower assist);3: Chair or W/C > Bed: Mod A (lift or lower assist)  FIM - Locomotion: Wheelchair Locomotion: Wheelchair: 0: Activity did not occur FIM - Locomotion: Ambulation Locomotion: Ambulation Assistive Devices: Walker - Eva;Orthosis (toe off AFO) Ambulation/Gait Assistance: 1: +2 Total assist Locomotion: Ambulation: 1: Two helpers  Comprehension Comprehension Mode: Auditory Comprehension: 4-Understands basic 75 - 89% of the time/requires cueing 10 - 24% of the time  Expression Expression Mode: Verbal Expression: 5-Expresses basic needs/ideas: With extra time/assistive device  Social Interaction Social Interaction: 5-Interacts appropriately 90% of the time - Needs monitoring or encouragement for participation or interaction.  Problem Solving Problem Solving: 3-Solves basic 50 - 74% of the time/requires cueing 25 - 49% of the time  Memory Memory: 4-Recognizes or recalls 75 - 89% of the time/requires cueing 10 - 24% of the time  Medical Problem List and Plan:  1. Right frontal parietal CVA after ERCP  2. DVT Prophylaxis/Anticoagulation: SCDs. Monitor for any signs of DVT.  3. Acute non-ST segment elevation myocardial infarction. Cardiac cath unremarkable. No complaints of chest pain or shortness of breath. Continue medical management with aspirin and Xarelto  4. Obstructive jaundice/history ileostomy. Monitor LFTs and  routine care of ileostomy. Continue Protonix  -pt,wife had been independent with ostomy care PTA  -serial labs  5. Hypertension. Lisinopril 10 mg daily and Lopressor 25 mg twice a day. Monitor with increased mobility hild lisinopril today for BP 100 systolic 6. PAF. Xarelto 20 mg daily. Cardiac rate controlled  6. Hyperlipidemia. Crestor  7. Dysphagia. Currently on dysphagia 3 nectar liquids. We'll have speech therapy followup  8. Tone: ROM, splinting- consider medication if resistant to conservative measures  9.  Leukocytosis likely secondary to commonbile duct obstruction , afeb, monitor WBC cont ceftin   LOS (Days) 4 A FACE TO FACE EVALUATION WAS PERFORMED  Rudie Rikard E 10/24/2011, 7:21 AM

## 2011-10-24 NOTE — Progress Notes (Signed)
Occupational Therapy Session Note  Patient Details  Name: Shawn Wiley MRN: [id number] Date of Birth: 08/27/1935  Today's Date: 10/24/2011  Precautions:  Precautions Precautions: Fall Restrictions Weight Bearing Restrictions: No  Short Term Goals: Week 1:  OT Short Term Goal 1 (Week 1): Patient will complete transfers functional transfers with min assist OT Short Term Goal 2 (Week 1): Patient will complete upper body bathing with supervision and setup OT Short Term Goal 3 (Week 1): Patient will complete lower body bathing with min assist OT Short Term Goal 4 (Week 1): Patient will complete upper body dressing with min assist OT Short Term Goal 5 (Week 1): Patient will complete lower body dressing with moderate assist  First Session: Time: 0800-0900 Time Calculation (min): 60 min  Skilled Therapeutic Interventions:  Self care retraining to include shower, dress and groom.  Focus session on safe bed>w/c><shower bench transfers, patient to slow down and focus on task (easily distracted), patient learning the management of LUE and LLE with all mobility and self care tasks, hemi techniques for bath and dress, attend to left side of body, heavy weight bearing onto LLE, hand over hand for LUE to attempt to assist with bath.    Pain: no c/o pain  See FIM for current functional status  Therapy/Group: Individual Therapy and wife present to observe and provided assistance PRN.  ------------------------------------------------------------------------------------------------  Second session: Time: 3:10-3:55 Time Calculation (min):  45 min  Pain Assessment: No c/o pain  Skilled Therapeutic Interventions: Discussed bathroom set up at home to review options and recommendations.  Clearance for bathroom door is 21 inches therefore, w/c will not fit in the bathroom.  If patient unable to ambulate into bathroom with assistance from grandson, patient will sponge bathe initially.  LUE PROM,  AROM yet when patient attempts to activate left side of body, his entire body responds resulting in abnormal movement patterns and spastic tone (extensor and inversion in LE, flexor/extensor in UE, shoulder elevates and trunk rotates.  Patient sometimes able to focus on more controlled movements in LUE yet is still an "all or none" result at this time.  See FIM for current functional status  Therapy/Group: Individual Therapy  Glenn Gullickson 10/24/2011, 9:37 AM

## 2011-10-24 NOTE — Care Management Note (Signed)
Provided cost of pt's med, Xarelto, to pt's wife at her request (>$315). She reports that they will not be able to pay for med. Called pt's cardiologist office and left message for the prescribing MD, Dr Beverely Pace, explaining pt's situation and inquired about possible alternative medication. Await reply.

## 2011-10-24 NOTE — Plan of Care (Signed)
Problem: RH SKIN INTEGRITY Goal: RH STG SKIN FREE OF INFECTION/BREAKDOWN Min Assist

## 2011-10-25 MED ORDER — TIZANIDINE HCL 2 MG PO TABS
2.0000 mg | ORAL_TABLET | Freq: Three times a day (TID) | ORAL | Status: DC
  Administered 2011-10-25 – 2011-11-10 (×49): 2 mg via ORAL
  Filled 2011-10-25 (×52): qty 1

## 2011-10-25 MED ORDER — DIPHENHYDRAMINE HCL 25 MG PO CAPS
25.0000 mg | ORAL_CAPSULE | Freq: Four times a day (QID) | ORAL | Status: DC | PRN
  Administered 2011-10-27 – 2011-11-09 (×13): 25 mg via ORAL
  Filled 2011-10-25 (×14): qty 1

## 2011-10-25 MED ORDER — AMITRIPTYLINE HCL 25 MG PO TABS
25.0000 mg | ORAL_TABLET | Freq: Every day | ORAL | Status: DC
  Administered 2011-10-25 – 2011-11-09 (×16): 25 mg via ORAL
  Filled 2011-10-25 (×17): qty 1

## 2011-10-25 NOTE — Progress Notes (Signed)
Physical Therapy Note  Patient Details  Name: Shawn Wiley MRN: [id number] Date of Birth: 25-Jul-1935 Today's Date: 10/25/2011  11:30-12:10 individual therapy pt denied pain.  Pt demonstrated carry over with bed mobility and rolled to left with supervision and required mod assist to sit with vc to squeeze knees together. performed squats while holding back of wc x 20 with left knee blocked into neutral to decrease tone in hip.  performed reaching with rotation to strengthen trunk and to aid decreasing tone x 20, performed Squat pivot to [facility] mod assist sit to stand with mod assist for pt to manage pants down and up with left knee blocked into neutral.  Safety plan updated for [facility] transfer for urination per pt request.   Julian Reil 10/25/2011, 12:20 PM

## 2011-10-25 NOTE — Progress Notes (Signed)
Physical Therapy Note  Patient Details  Name: IMARI SIVERTSEN MRN: [id number] Date of Birth: [birth year] Today's Date: 10/25/2011  1400-1455 (55 minutes) individual Pain: no complaint of pain Focus of treatment: Therapeutic activities to facilitate increased weight bearing and forced use of LT LE in stance to normalize increased extensor tone Treatment: (Recurvatum brace + ace wrap LT LE) stand to partial sit 3 X 10 with mod assist to maintain LT LE in place and prevent ankle inversion; gait 5 feet forward and backward along rail mod assist for balance and to maintain neutral ankle alignment and foot placement; standing with RT LE on 4 inch step to increase weight bearing Lt LE (forced use). asHOUT,JIM 10/25/2011, 2:56 PM

## 2011-10-25 NOTE — Progress Notes (Addendum)
Patient ID: Shawn Wiley, male   DOB: 06/01/1935, 76 y.o.   MRN: [id number]  Subjective/Complaints: Poor sleep woke up 3am OT notes severe spasticity Review of Systems  Respiratory: Positive for cough and sputum production. Negative for hemoptysis, shortness of breath and wheezing.   All other systems reviewed and are negative.   Objective: Vital Signs: Blood pressure 118/71, pulse 71, temperature 98.7 F (37.1 C), temperature source Oral, resp. rate 17, height 5\' 10"  (1.778 m), weight 77.9 kg (171 lb 11.8 oz), SpO2 94.00%. No results found. Results for orders placed during the hospital encounter of 10/20/11 (from the past 72 hour(s))  CBC     Status: Abnormal   Collection Time   10/23/11  6:00 AM      Component Value Range Comment   WBC 12.0 (*) 4.0 - 10.5 K/uL    RBC 3.99 (*) 4.22 - 5.81 MIL/uL    Hemoglobin 12.1 (*) 13.0 - 17.0 g/dL    HCT 40.9 (*) 81.1 - 52.0 %    MCV 89.5  78.0 - 100.0 fL    MCH 30.3  26.0 - 34.0 pg    MCHC 33.9  30.0 - 36.0 g/dL    RDW 91.4  78.2 - 95.6 %    Platelets 610 (*) 150 - 400 K/uL   COMPREHENSIVE METABOLIC PANEL     Status: Abnormal   Collection Time   10/23/11  6:00 AM      Component Value Range Comment   Sodium 129 (*) 135 - 145 mEq/L    Potassium 4.7  3.5 - 5.1 mEq/L    Chloride 93 (*) 96 - 112 mEq/L    CO2 26  19 - 32 mEq/L    Glucose, Bld 99  70 - 99 mg/dL    BUN 16  6 - 23 mg/dL    Creatinine, Ser 2.13  0.50 - 1.35 mg/dL    Calcium 08.6  8.4 - 10.5 mg/dL    Total Protein 6.9  6.0 - 8.3 g/dL    Albumin 3.3 (*) 3.5 - 5.2 g/dL    AST 42 (*) 0 - 37 U/L    ALT 55 (*) 0 - 53 U/L    Alkaline Phosphatase 161 (*) 39 - 117 U/L    Total Bilirubin 1.7 (*) 0.3 - 1.2 mg/dL    GFR calc non Af Amer 83 (*) >90 mL/min    GFR calc Af Amer >90  >90 mL/min   DIFFERENTIAL     Status: Abnormal   Collection Time   10/23/11  6:00 AM      Component Value Range Comment   Neutrophils Relative 63  43 - 77 %    Neutro Abs 7.6  1.7 - 7.7 K/uL    Lymphocytes  Relative 22  12 - 46 %    Lymphs Abs 2.6  0.7 - 4.0 K/uL    Monocytes Relative 12  3 - 12 %    Monocytes Absolute 1.4 (*) 0.1 - 1.0 K/uL    Eosinophils Relative 3  0 - 5 %    Eosinophils Absolute 0.3  0.0 - 0.7 K/uL    Basophils Relative 0  0 - 1 %    Basophils Absolute 0.1  0.0 - 0.1 K/uL      HEENT: normal Cardio: RRR Resp: CTA B/L GI: BS positive and RLQ colostomy Extremity:  No Edema Skin:   Intact Neuro: Alert/Oriented, Abnormal Sensory tingling with Lt touch LUE and LLE and Abnormal Motor 3-/5  with flexor synergy LUE, 2-/5 with ext synergy LLE except 0/5 L ankle Musc/Skel:  Normal Tone ashworth 3 at triceps, 2 in biceps and finger/wrist flexors  Assessment/Plan: 1. Functional deficits secondary to R frontal and pariental infarct as well as decondtioning which require 3+ hours per day of interdisciplinary therapy in a comprehensive inpatient rehab setting. Physiatrist is providing close team supervision and 24 hour management of active medical problems listed below. Physiatrist and rehab team continue to assess barriers to discharge/monitor patient progress toward functional and medical goals. FIM: FIM - Bathing Bathing Steps Patient Completed: Chest;Abdomen;Front perineal area;Right upper leg;Left upper leg;Right lower leg (including foot);Left Arm Bathing: 3: Mod-Patient completes 5-7 36f 10 parts or 50-74%  FIM - Upper Body Dressing/Undressing Upper body dressing/undressing steps patient completed: Thread/unthread right sleeve of pullover shirt/dresss;Put head through opening of pull over shirt/dress;Pull shirt over trunk Upper body dressing/undressing: 4: Min-Patient completed 75 plus % of tasks FIM - Lower Body Dressing/Undressing Lower body dressing/undressing steps patient completed: Thread/unthread right underwear leg;Thread/unthread right pants leg Lower body dressing/undressing: 3: Mod-Patient completed 50-74% of tasks  FIM - Hotel manager Devices:  Toilet Aid/prosthesis/orthosis Toileting: 0: Activity did not occur  FIM - Diplomatic Services operational officer Devices: Grab bars Toilet Transfers: 0-Activity did not occur  FIM - Banker Devices: Arm rests Bed/Chair Transfer: 5: Supine > Sit: Supervision (verbal cues/safety issues);5: Sit > Supine: Supervision (verbal cues/safety issues);4: Chair or W/C > Bed: Min A (steadying Pt. > 75%);3: Bed > Chair or W/C: Mod A (lift or lower assist)  FIM - Locomotion: Wheelchair Locomotion: Wheelchair: 2: Travels 50 - 149 ft with minimal assistance (Pt.>75%) FIM - Locomotion: Ambulation Locomotion: Ambulation Assistive Devices: Walker - Eva;Orthosis (toe off AFO) Ambulation/Gait Assistance: 1: +2 Total assist Locomotion: Ambulation: 0: Activity did not occur  Comprehension Comprehension Mode: Auditory Comprehension: 4-Understands basic 75 - 89% of the time/requires cueing 10 - 24% of the time  Expression Expression Mode: Verbal Expression: 5-Expresses basic needs/ideas: With extra time/assistive device  Social Interaction Social Interaction: 5-Interacts appropriately 90% of the time - Needs monitoring or encouragement for participation or interaction.  Problem Solving Problem Solving: 3-Solves basic 50 - 74% of the time/requires cueing 25 - 49% of the time  Memory Memory: 4-Recognizes or recalls 75 - 89% of the time/requires cueing 10 - 24% of the time  Medical Problem List and Plan:  1. Right frontal parietal CVA after ERCP , spasticity an issue will add zanaflex 2. DVT Prophylaxis/Anticoagulation: SCDs. Monitor for any signs of DVT.  3. Acute non-ST segment elevation myocardial infarction. Cardiac cath unremarkable. No complaints of chest pain or shortness of breath. Continue medical management with aspirin and Xarelto  4. Obstructive jaundice/history ileostomy. Monitor LFTs and routine care of ileostomy. Continue Protonix  -pt,wife had  been independent with ostomy care PTA  -serial labs  5. Hypertension. Lisinopril 10 mg daily and Lopressor 25 mg twice a day. Monitor with increased mobility hild lisinopril today for BP 100 systolic 6. PAF. Xarelto 20 mg daily. Cardiac rate controlled , monitor renal fx 6. Hyperlipidemia. Crestor  7. Dysphagia. Currently on dysphagia 3 nectar liquids. We'll have speech therapy followup  8. Tone: ROM, splinting- consider medication if resistant to conservative measures  9.  Leukocytosis likely secondary to commonbile duct obstruction , afeb, monitor WBC cont ceftin 10. Insomnia change ambien to CR formulation  LOS (Days) 5 A FACE TO FACE EVALUATION WAS PERFORMED  Avishai Reihl E 10/25/2011, 8:31 AM

## 2011-10-25 NOTE — Progress Notes (Signed)
Occupational Therapy Session Note  Patient Details  Name: Shawn Wiley MRN: [id number] Date of Birth: 12-09-1935  Today's Date: 10/25/2011 Time: 0800-0900 Time Calculation (min): 60 min  Short Term Goals: Week 1:  OT Short Term Goal 1 (Week 1): Patient will complete transfers functional transfers with min assist OT Short Term Goal 2 (Week 1): Patient will complete upper body bathing with supervision and setup OT Short Term Goal 3 (Week 1): Patient will complete lower body bathing with min assist OT Short Term Goal 4 (Week 1): Patient will complete upper body dressing with min assist OT Short Term Goal 5 (Week 1): Patient will complete lower body dressing with moderate assist  Skilled Therapeutic Interventions/Progress Updates:  Self care retraining to include shower, dress and groom.  Focus session on safe bed>w/c><shower bench transfers, patient to slow down and focus on task (easily distracted), sit>< stand and sit><squat, patient learning the management of LUE and LLE with all mobility and self care tasks, hemi techniques for bath and dress, attend to left side of body, heavy weight bearing onto LLE, hand over hand for LUE to attempt to assist with bath.  Reviewed impaired sustained attention with patient and wife which included the need for a quiet environment so patient could focus.  Precautions:  Precautions Precautions: Fall Restrictions Weight Bearing Restrictions: No  Pain: No c/o pain  See FIM for current functional status  Therapy/Group: Individual Therapy and wife present to learn and observe  [name] 10/25/2011, 9:47 AM

## 2011-10-25 NOTE — Progress Notes (Signed)
Speech Language Pathology Daily Session Note  Patient Details  Name: Shawn Wiley MRN: [id number] Date of Birth: October 28, 1935  Today's Date: 10/25/2011 Time: 0915-1000 Time Calculation (min): 45 min  Short Term Goals: Week 1: SLP Short Term Goal 1 (Week 1): Pt will demosntrate funcitonal problem solving for basic tasks with supervision verbal and questioning cues.  SLP Short Term Goal 2 (Week 1): Pt will utilize speech intelligbility strategies at the conversation level with supervision verbal and questioning cues.  SLP Short Term Goal 3 (Week 1): Pt will consume trials of thin liquids without overt s/s of aspiraiton with Min verbal cueing.   Skilled Therapeutic Interventions: Treatment session focused on cognition treatment with SLP facilitating session with name of medication and paitent able to recall function with 7/10 accuracy.  Patient also required moderate assist semantic cues to sequence and problem solve wheelchair mobility task while attending to left upper extremity.    FIM:  Comprehension Comprehension Mode: Auditory Comprehension: 4-Understands basic 75 - 89% of the time/requires cueing 10 - 24% of the time Expression Expression Mode: Verbal Expression: 5-Expresses complex 90% of the time/cues < 10% of the time Social Interaction Social Interaction: 5-Interacts appropriately 90% of the time - Needs monitoring or encouragement for participation or interaction. Problem Solving Problem Solving: 3-Solves basic 50 - 74% of the time/requires cueing 25 - 49% of the time Memory Memory: 4-Recognizes or recalls 75 - 89% of the time/requires cueing 10 - 24% of the time FIM - Eating Eating Activity: 5: Set-up assist for cut food;5: Set-up assist for open containers  Pain Pain Assessment Pain Assessment: No/denies pain Pain Score: 0-No pain  Therapy/Group: Individual Therapy  Charlane Ferretti., CCC-SLP [phone]  Shawn Wiley 10/25/2011, 5:07 PM

## 2011-10-26 MED ORDER — TRAZODONE HCL 50 MG PO TABS
50.0000 mg | ORAL_TABLET | Freq: Once | ORAL | Status: DC
  Filled 2011-10-26: qty 1

## 2011-10-26 NOTE — Progress Notes (Signed)
Occupational Therapy Session Note  Patient Details  Name: Shawn Wiley MRN: [id number] Date of Birth: 1935-12-29  Today's Date: 10/26/2011 Time: 0800-0903 Time Calculation (min): 63 min  Short Term Goals: Week 1:  OT Short Term Goal 1 (Week 1): Patient will complete transfers functional transfers with min assist OT Short Term Goal 2 (Week 1): Patient will complete upper body bathing with supervision and setup OT Short Term Goal 3 (Week 1): Patient will complete lower body bathing with min assist OT Short Term Goal 4 (Week 1): Patient will complete upper body dressing with min assist OT Short Term Goal 5 (Week 1): Patient will complete lower body dressing with moderate assist  Skilled Therapeutic Interventions/Progress Updates:    Worked on bathing and dressing shower level.  Pt continually placing washcloth on the LLE when attempting to cross them resulting in the washcloth falling to the floor and the therapist having to pick it up.  Tried to cue pt to place in a different position in anticipation of this happening however he did not follow.  Needs total hand over hand assist to use the LUE as an active assist for washing the RUE.  Mod assist for sit to stand as well with decreased hip extension noted in standing.  Needs mod instructional cueing to recall and perform hemidressing techniques.  Mod assist needed to help him cross the LLE over th right and maintain while attempting to wash his foot or donn his pants.  Therapy Documentation Precautions:  Precautions Precautions: Fall Precaution Comments: left hemiparesis Restrictions Weight Bearing Restrictions: No  Pain: No report of pain  ADL: See FIM for current functional status  Therapy/Group: Individual Therapy  Meztli Llanas OTR/L 10/26/2011, 1:20 PM

## 2011-10-26 NOTE — Progress Notes (Signed)
Patient states that he was able to fall back asleep without the help of the additional sleep medication.  He states he slept fairly well.  Will continue to monitor.

## 2011-10-26 NOTE — Care Management Note (Signed)
Met with pt and wife yesterday to report on team conference. Both in agreement with supervision goals and d/c date of 11/10/11.

## 2011-10-26 NOTE — Progress Notes (Signed)
Per State Regulation 482.30 This chart was reviewed for medical necessity with respect to the patient's Admission/Duration of stay. Lane-Morgan, Terald Jump Anne                 Nurse Care Manager            Next Review Date: 10/30/11   

## 2011-10-26 NOTE — Progress Notes (Signed)
Nutrition Follow-up  Intervention:   1. RD to add yogurt at HS snack, per pt request 2. RD to continue to follow nutrition care plan  Diet Order:  Dysphagia 2 with thin liquids  Pt does not like Resource Breeze, discontinued. Pt continuing to eat well, mostly 90 - 100% of meals. Family requesting Gatorade daily, as pt usually drinks one daily PTA. Stated that we could provide orange flavor, however pt declined and stated that his family could provide the flavor he likes (lemon lime.)  Meds: Scheduled Meds:   . amitriptyline  25 mg Oral QHS  . aspirin EC  81 mg Oral Daily  . atorvastatin  20 mg Oral q1800  . B-complex with vitamin C  1 tablet Oral Daily  . lisinopril  10 mg Oral Daily  . metoprolol tartrate  25 mg Oral BID  . multivitamins with iron  1 tablet Oral Daily  . pantoprazole  40 mg Oral Daily  . rivaroxaban  20 mg Oral QPC supper  . tiZANidine  2 mg Oral TID  . traZODone  50 mg Oral Once   Continuous Infusions:  PRN Meds:.acetaminophen, diphenhydrAMINE, nitroGLYCERIN, ondansetron (ZOFRAN) IV, ondansetron, polyethylene glycol, sorbitol  Labs:  CMP     Component Value Date/Time   NA 129* 10/23/2011 0600   K 4.7 10/23/2011 0600   CL 93* 10/23/2011 0600   CO2 26 10/23/2011 0600   GLUCOSE 99 10/23/2011 0600   BUN 16 10/23/2011 0600   CREATININE 0.88 10/23/2011 0600   CALCIUM 10.0 10/23/2011 0600   PROT 6.9 10/23/2011 0600   ALBUMIN 3.3* 10/23/2011 0600   AST 42* 10/23/2011 0600   ALT 55* 10/23/2011 0600   ALKPHOS 161* 10/23/2011 0600   BILITOT 1.7* 10/23/2011 0600   GFRNONAA 83* 10/23/2011 0600   GFRAA >90 10/23/2011 0600     Intake/Output Summary (Last 24 hours) at 10/26/11 1443 Last data filed at 10/26/11 1300  Gross per 24 hour  Intake    600 ml  Output   1450 ml  Net   -850 ml  BM on 6/26  Weight Status:  74.3 kg - trending down  Re-estimated needs:  1785 - 2000 kcal, 82 - 98 grams protein  Nutrition Dx:  Increased nutrient needs r/t participation in rehab  therapies AEB estimated needs. Ongoing.  Goal:  Pt to consume at least 90% of estimated needs - met.  Monitor:  weights, labs, PO intake, I/O's  Jarold Motto MS, RD, LDN Pager#: [phone]

## 2011-10-26 NOTE — Patient Care Conference (Signed)
Inpatient RehabilitationTeam Conference Note Date: 10/25/2011   Time: 10:30 AM    Patient Name: Shawn Wiley      Medical Record Number: [id number]  Date of Birth: 1936/03/30 Sex: Male         Room/Bed: 4031/4031-01 Payor Info: Payor: MEDICARE  Plan: MEDICARE PART A AND B  Product Type: *No Product type*     Admitting Diagnosis: CVA/MI  Admit Date/Time:  10/20/2011 12:23 PM Admission Comments: No comment available   Primary Diagnosis:  CVA (cerebral infarction) Principal Problem: CVA (cerebral infarction)  Patient Active Problem List   Diagnosis Date Noted  . CVA (cerebral infarction) 10/20/2011    Expected Discharge Date: Expected Discharge Date: 11/10/11  Team Members Present: Physician: Dr. Claudette Laws Case Manager Present: Lutricia Horsfall, RN Social Worker Present: Amada Jupiter, LCSW Nurse Present: Laural Roes, RN PT Present: Edman Circle, Judith Blonder, PTA OT Present: Bretta Bang, Verlene Mayer, OT SLP Present: Fae Pippin, SLP PPS Coordinator: Tora Duck, RN    Current Status/Progress Goal Weekly Team Focus  Medical   urinary incontinence, good output through colostomy, poor sleep  improve sleep, improve continence  time toileting, medication adjustment   Bowel/Bladder   Colostomy-care by staff; staff assist with urinal-continent of bladder  Continent of bladder; manage bowel with colostomy      Swallow/Nutrition/ Hydration   Dys.2 textures and thin liquids, supervisoin assist  Overall Supervision  education   ADL's   Supervision- Self feeding, Min Assist- UB dressing, Mod Assist- [place], LB Dress, Engineer, maintenance (IT)  Overall Supervision for self care tasks except [place]. Assist for shower transfer   Sustained attention, LUE & LLE neuro re-ed, heavy weight bearing, normal movement patterns, care partner education    Mobility   mod assist squat transfer, 2 person gait and steps  min assist overall gait and wc  gait neuromuslcular reeducation.,  AFO   Communication   supervision   Mod I   increase carryover speech compensatory strategies   Safety/Cognition/ Behavioral Observations  min assist  supervision   increase self monitoring and correcting   Pain   Occasional headache managed with tylenol PRN  </=3      Skin   Colostomy intact; bruising to BUE and abdomen  No new skin breakdown         *See Interdisciplinary Assessment and Plan and progress notes for long and short-term goals  Barriers to Discharge: patient unable to manage his colostomy anymore    Possible Resolutions to Barriers:  family training    Discharge Planning/Teaching Needs:    Home with wife, available 24/7.     Team Discussion:  Discussion of pt's dx, hx, progress, goals and d/c plan. Pt's wife present for some therapies. Pt and wife very concerned re: cost of meds, requesting alternative to Xarelto. Pt's cardiologist to address.  Revisions to Treatment Plan:     Continued Need for Acute Rehabilitation Level of Care: The patient requires daily medical management by a physician with specialized training in physical medicine and rehabilitation for the following conditions: Daily direction of a multidisciplinary physical rehabilitation program to ensure safe treatment while eliciting the highest outcome that is of practical value to the patient.: Yes Daily medical management of patient stability for increased activity during participation in an intensive rehabilitation regime.: Yes Daily analysis of laboratory values and/or radiology reports with any subsequent need for medication adjustment of medical intervention for : Post surgical problems;Neurological problems  Meryl Dare 10/26/2011, 11:47 AM

## 2011-10-26 NOTE — Progress Notes (Signed)
Patient ID: Shawn Wiley, male   DOB: 1935-12-02, 76 y.o.   MRN: [id number]  Subjective/Complaints: Poor sleep woke up 3am OT notes severe spasticity Review of Systems  Respiratory: Positive for cough and sputum production. Negative for hemoptysis, shortness of breath and wheezing.   All other systems reviewed and are negative.   Objective: Vital Signs: Blood pressure 102/62, pulse 67, temperature 98.9 F (37.2 C), temperature source Oral, resp. rate 18, height 5\' 10"  (1.778 m), weight 74.3 kg (163 lb 12.8 oz), SpO2 99.00%. No results found. No results found for this or any previous visit (from the past 72 hour(s)).   HEENT: normal Cardio: RRR Resp: CTA B/L GI: BS positive and RLQ colostomy Extremity:  No Edema Skin:   Intact Neuro: Alert/Oriented, Abnormal Sensory tingling with Lt touch LUE and LLE and Abnormal Motor 3-/5 with flexor synergy LUE, 2-/5 with ext synergy LLE except 0/5 L ankle Musc/Skel:  Normal Tone ashworth 3 at triceps, 2 in biceps and finger/wrist flexors  Assessment/Plan: 1. Functional deficits secondary to R frontal and pariental infarct as well as decondtioning which require 3+ hours per day of interdisciplinary therapy in a comprehensive inpatient rehab setting. Physiatrist is providing close team supervision and 24 hour management of active medical problems listed below. Physiatrist and rehab team continue to assess barriers to discharge/monitor patient progress toward functional and medical goals. FIM: FIM - Bathing Bathing Steps Patient Completed: Chest;Abdomen;Front perineal area;Right upper leg;Left upper leg;Right lower leg (including foot);Left Arm Bathing: 3: Mod-Patient completes 5-7 57f 10 parts or 50-74%  FIM - Upper Body Dressing/Undressing Upper body dressing/undressing steps patient completed: Thread/unthread right sleeve of pullover shirt/dresss;Put head through opening of pull over shirt/dress;Pull shirt over trunk Upper body  dressing/undressing: 4: Min-Patient completed 75 plus % of tasks FIM - Lower Body Dressing/Undressing Lower body dressing/undressing steps patient completed: Thread/unthread right underwear leg;Thread/unthread right pants leg;Thread/unthread left underwear leg;Thread/unthread left pants leg Lower body dressing/undressing: 3: Mod-Patient completed 50-74% of tasks  FIM - Hotel manager Devices: Toilet Aid/prosthesis/orthosis Toileting: 0: Activity did not occur  FIM - Diplomatic Services operational officer Devices: Grab bars Toilet Transfers: 0-Activity did not occur  FIM - Banker Devices: Arm rests Bed/Chair Transfer: 3: Supine > Sit: Mod A (lifting assist/Pt. 50-74%/lift 2 legs;3: Bed > Chair or W/C: Mod A (lift or lower assist)  FIM - Locomotion: Wheelchair Locomotion: Wheelchair: 2: Travels 50 - 149 ft with minimal assistance (Pt.>75%) FIM - Locomotion: Ambulation Locomotion: Ambulation Assistive Devices: Walker - Eva;Orthosis (toe off AFO) Ambulation/Gait Assistance: 1: +2 Total assist Locomotion: Ambulation: 0: Activity did not occur  Comprehension Comprehension Mode: Auditory Comprehension: 4-Understands basic 75 - 89% of the time/requires cueing 10 - 24% of the time  Expression Expression Mode: Verbal Expression: 5-Expresses basic needs/ideas: With extra time/assistive device  Social Interaction Social Interaction: 5-Interacts appropriately 90% of the time - Needs monitoring or encouragement for participation or interaction.  Problem Solving Problem Solving: 3-Solves basic 50 - 74% of the time/requires cueing 25 - 49% of the time  Memory Memory: 4-Recognizes or recalls 75 - 89% of the time/requires cueing 10 - 24% of the time  Medical Problem List and Plan:  1. Right frontal parietal CVA after ERCP , spasticity an issue will add zanaflex 2. DVT Prophylaxis/Anticoagulation: SCDs. Monitor for any signs of DVT.   3. Acute non-ST segment elevation myocardial infarction. Cardiac cath unremarkable. No complaints of chest pain or shortness of breath. Continue medical management with aspirin  and Xarelto, trying to get pt assistance  4. Obstructive jaundice/history ileostomy. Monitor LFTs and routine care of ileostomy. Continue Protonix  -pt,wife had been independent with ostomy care PTA  -serial labs  5. Hypertension. Lisinopril 10 mg daily and Lopressor 25 mg twice a day. Monitor with increased mobility hild lisinopril today for BP 100 systolic 6. PAF. Xarelto 20 mg daily. Cardiac rate controlled , monitor renal fx 6. Hyperlipidemia. Crestor  7. Dysphagia. Currently on dysphagia 3 nectar liquids. We'll have speech therapy followup  8. Tone: ROM, splinting- consider medication if resistant to conservative measures  9.  Leukocytosis likely secondary to commonbile duct obstruction , afeb, monitor WBC cont ceftin 10. Insomnia change ambien to CR formulation  LOS (Days) 6 A FACE TO FACE EVALUATION WAS PERFORMED  Adler Chartrand E 10/26/2011, 7:58 AM

## 2011-10-26 NOTE — Progress Notes (Signed)
Speech Language Pathology Daily Session Note  Patient Details  Name: Shawn Wiley MRN: [id number] Date of Birth: 12-26-1935  Today's Date: 10/26/2011 Time: 0915-1000 Time Calculation (min): 45 min  Short Term Goals: Week 1: SLP Short Term Goal 1 (Week 1): Pt will demosntrate funcitonal problem solving for basic tasks with supervision verbal and questioning cues.  SLP Short Term Goal 2 (Week 1): Pt will utilize speech intelligbility strategies at the conversation level with supervision verbal and questioning cues.  SLP Short Term Goal 3 (Week 1): Pt will consume trials of thin liquids without overt s/s of aspiraiton with Min verbal cueing.   Skilled Therapeutic Interventions: Treatment session focused on dysphagia and cogntion; SLP facilitated session with utilizing written aid to load medication box with current medications and paitent requested assistance and needed and required  mod assist semantic cues to  perform task as well as min assist cues to self monitor and correct.  RN present for medication administration and patient consumed medications, one at a time with thin liquid sips and no overt s/s of aspiration; recommend upgrade.     FIM:  Comprehension Comprehension Mode: Auditory Comprehension: 5-Understands basic 90% of the time/requires cueing < 10% of the time Expression Expression Mode: Verbal Expression: 5-Expresses basic needs/ideas: With no assist Social Interaction Social Interaction: 6-Interacts appropriately with others with medication or extra time (anti-anxiety, antidepressant). Problem Solving Problem Solving: 5-Solves basic 90% of the time/requires cueing < 10% of the time Memory Memory: 5-Recognizes or recalls 90% of the time/requires cueing < 10% of the time FIM - Eating Eating Activity: 5: Set-up assist for open containers  Pain Pain Assessment Pain Assessment: No/denies pain Pain Score: 0-No pain  Therapy/Group: Individual Therapy  Charlane Ferretti., CCC-SLP [phone]  Thomasene Dubow 10/26/2011, 4:22 PM

## 2011-10-26 NOTE — Progress Notes (Signed)
Patient complaining of insomnia.  Notified Deatra Ina, PA of patient complaint and ineffectiveness of elavil to promote sleep.  New order received for one time dose of trazadone.  Will continue to monitor.

## 2011-10-26 NOTE — Progress Notes (Signed)
Occupational Therapy Session Note  Patient Details  Name: HOA DERISO MRN: [id number] Date of Birth: 1935/12/16  Today's Date: 10/26/2011 Time: 0915-1000 Time Calculation (min): 45 min   Skilled Therapeutic Interventions/Progress Updates:    Practiced toilet transfers with mod facilitation to 3:1 over the toilet.  Wife present and able to observe session.  Pt needs max instructional cueing to initiate and perform clothing management and hygiene.  Also discussed setup of walk-in shower and types of seats available to be used as well.  Educated pt on LUE AAROM exercise using his half lap tray as well.  Emphasis on external rotation secondary to pt with increased internal rotation tone.    Therapy Documentation Precautions:  Precautions Precautions: Fall Precaution Comments: L inattention Restrictions Weight Bearing Restrictions: No  Pain: Pain Assessment Pain Assessment: No/denies pain  See FIM for current functional status  Therapy/Group: Individual Therapy  Makita Blow OTR/L 10/26/2011, 4:07 PM

## 2011-10-26 NOTE — Progress Notes (Signed)
After receiving the order for the one time dose of trazadone, the patient decided he did not want to take anything else for sleep because he did not want to be tired during his morning therapy session.  Will continue to monitor.

## 2011-10-26 NOTE — Care Management Note (Signed)
Received call from Dr [facility] nurse yesterday, who reported that he advises pt to remain on Xarelto. She provided info and # re: pt assistance program for purchasing med. Explanation and written info given to pt's wife, she expressed understanding.

## 2011-10-26 NOTE — Progress Notes (Signed)
Physical Therapy Session Note  Patient Details  Name: Shawn Wiley MRN: [id number] Date of Birth: 1935-07-29  Today's Date: 10/26/2011 Time: 1035-1130 Time Calculation (min): 55 min  Short Term Goals: Week 1:  PT Short Term Goal 1 (Week 1): Patient will be able to perform Bed mobility with Mod-Assist. PT Short Term Goal 2 (Week 1): Patient will be able to perform Transfers with Mod-Assist. PT Short Term Goal 3 (Week 1): Patient will be able to propel manual w/c 150' in controlled and household environments with min-Assist. PT Short Term Goal 4 (Week 1): Patient will be able to ascend/descend 2 steps with Max-Assist. PT Short Term Goal 5 (Week 1): Patient will be able to ambulate 73' using LRAD with Max-Assist.  Skilled Therapeutic Interventions/Progress Updates: focus on neuromuscular re-education via forced use, demo, VCs; transfer and gait training, w/c mobility  W/c>< Kinetron stand pivot transfer to L and R with mod assist and max VCs .  Kinetron at resistance 30-40 cm/sec for LLE neuromuscular re-education for graded L hip and knee extension without mass extension due to extensor hypertonus, x 2-3 minutes, x 2.    Gait training in hallway using rail on R; ACE on L foot to control PF due to hypertonus, and L knee cage brace to limit hyperextension with wb'ing.  Gait forward x 35' x 2, backward x 1, with mod> max assist and max VCs for balance, L knee stability before wt shifting to L, upright stance, forward gaze.  W/c mobility using hemi method, supervision x 70' in hallway, before c/o RLE fatigue.  Pt tends to overuse RLE to propel, rarely using RUE on w/c rim, slighlty improved with VCs.     Therapy Documentation Precautions:  Precautions Precautions: Fall Precaution Comments: L inattention Restrictions Weight Bearing Restrictions: No   Pain: Pain Assessment Pain Assessment: No/denies pain   Locomotion : Ambulation Ambulation/Gait Assistance: 2: Max assist      See  FIM for current functional status  Therapy/Group: Individual Therapy  Desmond Szabo 10/26/2011, 3:00 PM

## 2011-10-27 DIAGNOSIS — Z5189 Encounter for other specified aftercare: Secondary | ICD-10-CM

## 2011-10-27 DIAGNOSIS — I6330 Cerebral infarction due to thrombosis of unspecified cerebral artery: Secondary | ICD-10-CM

## 2011-10-27 MED ORDER — BIOTENE DRY MOUTH MT LIQD
15.0000 mL | Freq: Two times a day (BID) | OROMUCOSAL | Status: DC
  Administered 2011-10-28 – 2011-11-07 (×10): 15 mL via OROMUCOSAL

## 2011-10-27 NOTE — Progress Notes (Signed)
Patient ID: Shawn Wiley, male   DOB: 1935-05-17, 76 y.o.   MRN: [id number]  Subjective/Complaints: Slept well OT notes severe spasticity Review of Systems  Respiratory: Positive for cough and sputum production. Negative for hemoptysis, shortness of breath and wheezing.   All other systems reviewed and are negative.   Objective: Vital Signs: Blood pressure 132/64, pulse 72, temperature 98.5 F (36.9 C), temperature source Oral, resp. rate 20, height 5\' 10"  (1.778 m), weight 74.3 kg (163 lb 12.8 oz), SpO2 98.00%. No results found. No results found for this or any previous visit (from the past 72 hour(s)).   HEENT: normal Cardio: RRR Resp: CTA B/L GI: BS positive and RLQ colostomy Extremity:  No Edema Skin:   Intact Neuro: Alert/Oriented, Abnormal Sensory tingling with Lt touch LUE and LLE and Abnormal Motor 3-/5 with flexor synergy LUE, 2-/5 with ext synergy LLE except 0/5 L ankle Musc/Skel:  Normal Tone ashworth 3 at triceps, 2 in biceps and finger/wrist flexors  Assessment/Plan: 1. Functional deficits secondary to R frontal and pariental infarct as well as decondtioning which require 3+ hours per day of interdisciplinary therapy in a comprehensive inpatient rehab setting. Physiatrist is providing close team supervision and 24 hour management of active medical problems listed below. Physiatrist and rehab team continue to assess barriers to discharge/monitor patient progress toward functional and medical goals. FIM: FIM - Bathing Bathing Steps Patient Completed: Chest;Abdomen;Right upper leg;Left upper leg;Left lower leg (including foot);Left Arm Bathing: 3: Mod-Patient completes 5-7 66f 10 parts or 50-74%  FIM - Upper Body Dressing/Undressing Upper body dressing/undressing steps patient completed: Thread/unthread right sleeve of pullover shirt/dresss;Put head through opening of pull over shirt/dress;Pull shirt over trunk Upper body dressing/undressing: 4: Min-Patient completed 75  plus % of tasks FIM - Lower Body Dressing/Undressing Lower body dressing/undressing steps patient completed: Thread/unthread left underwear leg;Thread/unthread right pants leg;Don/Doff right shoe Lower body dressing/undressing: 2: Max-Patient completed 25-49% of tasks  FIM - Toileting Toileting steps completed by patient: Performs perineal hygiene;Adjust clothing after toileting Toileting Assistive Devices: Toilet Aid/prosthesis/orthosis Toileting: 3: Mod-Patient completed 2 of 3 steps  FIM - Diplomatic Services operational officer Devices: Bedside commode;Grab bars Toilet Transfers: 3-To toilet/BSC: Mod A (lift or lower assist);3-From toilet/BSC: Mod A (lift or lower assist)  FIM - Bed/Chair Transfer Bed/Chair Transfer Assistive Devices: Arm rests Bed/Chair Transfer: 0: Activity did not occur  FIM - Locomotion: Wheelchair Locomotion: Wheelchair: 2: Travels 50 - 149 ft with supervision, cueing or coaxing FIM - Locomotion: Ambulation Locomotion: Ambulation Assistive Devices: Orthosis (hallway rail, on R) Ambulation/Gait Assistance: 2: Max assist Locomotion: Ambulation: 1: Travels less than 50 ft with maximal assistance (Pt: 25 - 49%)  Comprehension Comprehension Mode: Auditory Comprehension: 5-Understands basic 90% of the time/requires cueing < 10% of the time  Expression Expression Mode: Verbal Expression: 5-Expresses basic needs/ideas: With extra time/assistive device  Social Interaction Social Interaction: 6-Interacts appropriately with others with medication or extra time (anti-anxiety, antidepressant).  Problem Solving Problem Solving: 5-Solves basic 90% of the time/requires cueing < 10% of the time  Memory Memory: 5-Recognizes or recalls 90% of the time/requires cueing < 10% of the time  Medical Problem List and Plan:  1. Right frontal parietal CVA after ERCP , spasticity an issue will add zanaflex 2. DVT Prophylaxis/Anticoagulation: SCDs. Monitor for any signs of  DVT.  3. Acute non-ST segment elevation myocardial infarction. Cardiac cath unremarkable. No complaints of chest pain or shortness of breath. Continue medical management with aspirin and Xarelto, trying to get pt assistance  4. Obstructive jaundice/history ileostomy. Monitor LFTs and routine care of ileostomy. Continue Protonix  -pt,wife had been independent with ostomy care PTA  -serial labs  5. Hypertension. Lisinopril 10 mg daily and Lopressor 25 mg twice a day. Monitor with increased mobility hild lisinopril today for BP 100 systolic 6. PAF. Xarelto 20 mg daily. Cardiac rate controlled , monitor renal fx 6. Hyperlipidemia. Crestor  7. Dysphagia. Currently on dysphagia 3 nectar liquids. We'll have speech therapy followup  8. Tone: ROM, splinting, tizanidine 9.  Leukocytosis likely secondary to commonbile duct obstruction , afeb, monitor WBC cont ceftin 10. Insomnia change ambien to CR formulation  LOS (Days) 7 A FACE TO FACE EVALUATION WAS PERFORMED  Aston Lawhorn E 10/27/2011, 8:09 AM

## 2011-10-27 NOTE — Progress Notes (Signed)
Speech Language Pathology Weekly Progress Note  Patient Details  Name: Shawn Wiley MRN: [id number] Date of Birth: 1936-02-26  Today's Date: 10/27/2011 Time: 1000-1045 Time Calculation (min): 45 min  Short Term Goals: Week 1: SLP Short Term Goal 1 (Week 1): Pt will demosntrate funcitonal problem solving for basic tasks with supervision verbal and questioning cues.  SLP Short Term Goal 1 - Progress (Week 1): Progressing toward goal SLP Short Term Goal 2 (Week 1): Pt will utilize speech intelligbility strategies at the conversation level with supervision verbal and questioning cues.  SLP Short Term Goal 2 - Progress (Week 1): Met SLP Short Term Goal 3 (Week 1): Pt will consume trials of thin liquids without overt s/s of aspiraiton with Min verbal cueing.  SLP Short Term Goal 3 - Progress (Week 1): Met  Weekly Progress Updates: Pt has made gains this week in areas of problem-solving, speech error recognition, speech intelligibility, and dysphagia.  He is now tolerating thin liquids and has been upgraded to thin liquids with meals.  Dysarthria persists but pt shows improved recognition and correction of errors.    Today's Progress: Pt/wife engaged in verbal description task in which pt described items and his wife listened and identified them.  Pt utilized intelligibility strategies with supervision cues ( cues primarily to slow rate by 20% and over-articulate final consonants, which are omitted during running speech.) Pt showing improved recognition of omission/distortion errors and emerging ability to self-correct.  As session progressed, self-identification of errors improved to 60% in 5-min period.    SLP Frequency: 1-2 X/day, 30-60 minutes Estimated Length of Stay: 2-3 weeks SLP Treatment/Interventions: Cognitive remediation/compensation;Dysphagia/aspiration precaution training;Internal/external aids;Patient/family education;Therapeutic Activities;Functional tasks  Daily Session FIM:    Comprehension Comprehension Mode: Auditory Comprehension: 5-Understands complex 90% of the time/Cues < 10% of the time Expression Expression Mode: Verbal Expression: 5-Expresses basic needs/ideas: With no assist Social Interaction Social Interaction: 6-Interacts appropriately with others with medication or extra time (anti-anxiety, antidepressant). Problem Solving Problem Solving: 5-Solves basic 90% of the time/requires cueing < 10% of the time Memory Memory: 5-Recognizes or recalls 90% of the time/requires cueing < 10% of the time FIM - Eating Eating Activity: 5: Set-up assist for open containers General    Pain Pain Assessment Pain Assessment: No/denies pain Pain Score:   6 Pain Intervention(s): Medication (See eMAR)  Therapy/Group: Individual Therapy  Uldine Fuster L. Samson Frederic, [place] CCC/SLP Pager [phone]   Blenda Mounts Laurice 10/27/2011, 11:20 AM

## 2011-10-27 NOTE — Progress Notes (Signed)
Physical Therapy Weekly Progress Note  Patient Details  Name: Shawn Wiley MRN: [id number] Date of Birth: 02-14-1936  Today's Date: 10/27/2011 Time:0932-1002 and  1105-1200  Time Calculation (min): 30 min and 55 min  Patient has met 3 of 5 short term goals.  Pt's activity tolerance prevented w/c propulsion for distance of 150', and  impulsivity limited safely attempting stairs at this time.  Patient continues to demonstrate the following deficits:increased muscle tone LLE, balance,  poor muscle grading and activation LLE, impulsivity, poor awareness and recall and therefore will continue to benefit from skilled PT intervention to enhance overall performance with activity tolerance, balance, postural control, ability to compensate for deficits, functional use of  left lower extremity, attention, awareness and coordination.  Patient progressing toward long term goals..  Continue plan of care.  PT Short Term Goals Week 1:  PT Short Term Goal 1 (Week 1): Patient will be able to perform Bed mobility with Mod-Assist. PT Short Term Goal 1 - Progress (Week 1): Met PT Short Term Goal 2 (Week 1): Patient will be able to perform Transfers with Mod-Assist. PT Short Term Goal 2 - Progress (Week 1): Met PT Short Term Goal 3 (Week 1): Patient will be able to propel manual w/c 150' in controlled and household environments with min-Assist. PT Short Term Goal 3 - Progress (Week 1): Partly met (100') PT Short Term Goal 4 (Week 1): Patient will be able to ascend/descend 2 steps with Max-Assist. PT Short Term Goal 4 - Progress (Week 1): Not met PT Short Term Goal 5 (Week 1): Patient will be able to ambulate 30' using LRAD with Max-Assist. PT Short Term Goal 5 - Progress (Week 1): Met  Week 2:  PT Short Term Goal 1 (Week 2): Pt will perform bed mobility with min assist. PT Short Term Goal 2 (Week 2): pt will perform transfer to R with supervision. PT Short Term Goal 3 (Week 2): Pt will propel w/c x 150' with  supervision. PT Short Term Goal 4 (Week 2): Pt will perform gait x 35' with min/mod assist consistently, with LRAD.    Skilled Therapeutic Interventions/Progress Updates:    1st tx-   focus on neuromuscular re-education via forced use, demo, VCs, tactile cues;  mobility  W/c>< mat to L and R with min assist, stand/pivot; VCs to slow down due to impulsivity  Scooting on mat L><R working on forward wt shift, use of bil LEs to push, without use of UEs, cueing for placment of L foot.  Dynamic standing balance with min-mod assist, bil UEs support on high table,  during reaching activity to R and L, working on L knee control with wt bearing.  Pt has delayed awareness when L knee buckles or hyperextends.  2nd tx-  Focus on gait training, neuromuscular re-education via forced use, VCs, tactile cues, transfers, HEP, training wife in HEP  Gait in hall using rail on R, x 35', 20' forward; 35' backward, with mod to max assist for wt shift, L LE stability, upright control, forward gaze.  Pt had 2 instances of attempting to fully shift wt to LLE before moving it sufficiently, with resulting LOB requiring max assist to prevent fall.  Pt continues to demonstrate impulsivity, poor safety awareness and ST memory problems for sequencing, technique, attention to L UE and LLE.  W/c>< mat to L with min assist stand/pivot; to R with min assist, poor awareness of including LLE in process.  HEP of bil bridging, L unilateral bridging, bil  lower trunk rotation; wife and pt instructed in doing these 2x/day in room.  Hand-outs provided.  Bed mobility: rolling R with VCs for set-up; rolling L without VCs, with supervision; L sidelying > sit with mod assist, manual cues for bil hip and knee flexion before pushing up.      Therapy Documentation Precautions:  Precautions Precautions: Fall Precaution Comments: L inattention Restrictions Weight Bearing Restrictions: No   Pain: Pain Assessment Pain Assessment:  No/denies pain    Other Treatments: bil foot rests of w/c lowered for better positioning of pt    See FIM for current functional status  Therapy/Group: Individual Therapy  Shawn Wiley 10/27/2011, 2:58 PM

## 2011-10-27 NOTE — Progress Notes (Signed)
Physical Therapy Session Note  Patient Details  Name: Shawn Wiley MRN: [id number] Date of Birth: Jun 02, 1935  Today's Date: 10/27/2011 Time: 1308-6578 and 1105-1200 Time Calculation (min): 30 minand 55 min  Short Term Goals: Week 1:  PT Short Term Goal 1 (Week 1): Patient will be able to perform Bed mobility with Mod-Assist. PT Short Term Goal 2 (Week 1): Patient will be able to perform Transfers with Mod-Assist. PT Short Term Goal 3 (Week 1): Patient will be able to propel manual w/c 150' in controlled and household environments with min-Assist. PT Short Term Goal 4 (Week 1): Patient will be able to ascend/descend 2 steps with Max-Assist. PT Short Term Goal 5 (Week 1): Patient will be able to ambulate 39' using LRAD with Max-Assist.      Skilled Therapeutic Interventions/Progress Updates:    Therapy Documentation Precautions:  Precautions Precautions: Fall Precaution Comments: L inattention Restrictions Weight Bearing Restrictions: No General:   Vital Signs:   Pain: Pain Assessment Pain Assessment: No/denies pain Pain Score:   6 Pain Intervention(s): Medication (See eMAR) Mobility:   Locomotion :    Trunk/Postural Assessment :    Balance:   Exercises:   Other Treatments:    See FIM for current functional status  Therapy/Group: Individual Therapy  Journe Hallmark 10/27/2011, 12:24 PM

## 2011-10-27 NOTE — Progress Notes (Signed)
Occupational Therapy Session Note  Patient Details  Name: Shawn Wiley MRN: [id number] Date of Birth: [birth year]  Today's Date: 10/27/2011 Time: 0805-0905 Time Calculation (min): 60 min  Short Term Goals: Week 1:  OT Short Term Goal 1 (Week 1): Patient will complete transfers functional transfers with min assist OT Short Term Goal 2 (Week 1): Patient will complete upper body bathing with supervision and setup OT Short Term Goal 3 (Week 1): Patient will complete lower body bathing with min assist OT Short Term Goal 4 (Week 1): Patient will complete upper body dressing with min assist OT Short Term Goal 5 (Week 1): Patient will complete lower body dressing with moderate assist  Skilled Therapeutic Interventions/Progress Updates:    Performed therapeutic bathing and dressing at shower level.   Addressed transfers to wc, shower bench, LUE posiitoning and management.   Utilized LUE with max hand over hand assist to incorporate in bathing.  Pt demonstrated impulsive moves with standing in shower after OT had asked him to stay seated x2.  Wife present during session.  Educated her on husbands impulsive movements and hand placement for sit to stand and transfers.     Therapy Documentation Precautions:  Precautions Precautions: Fall Precaution Comments: L inattention Restrictions Weight Bearing Restrictions: No    Pain: Pain Assessment Pain Assessment: No/denies pain       See FIM for current functional status  Therapy/Group: Individual Therapy  Humberto Seals 10/27/2011, 1:46 PM

## 2011-10-28 NOTE — Progress Notes (Signed)
Patient ID: Shawn Wiley, male   DOB: 1935-08-09, 76 y.o.   MRN: [id number]  Subjective/Complaints: Slept well "L arm pulls to the R" Review of Systems  Respiratory: Positive for cough and sputum production. Negative for hemoptysis, shortness of breath and wheezing.   All other systems reviewed and are negative.   Objective: Vital Signs: Blood pressure 109/67, pulse 72, temperature 98.5 F (36.9 C), temperature source Oral, resp. rate 18, height 5\' 10"  (1.778 m), weight 80.5 kg (177 lb 7.5 oz), SpO2 97.00%. No results found. No results found for this or any previous visit (from the past 72 hour(s)).   HEENT: normal Cardio: RRR Resp: CTA B/L GI: BS positive and RLQ colostomy Extremity:  No Edema Skin:   Intact Neuro: Alert/Oriented, Abnormal Sensory tingling with Lt touch LUE and LLE and Abnormal Motor 3-/5 with flexor synergy LUE, 2-/5 with ext synergy LLE except 0/5 L ankle Musc/Skel:  Normal Tone ashworth 3 at triceps, 2 in biceps and finger/wrist flexors  Assessment/Plan: 1. Functional deficits secondary to R frontal and pariental infarct as well as decondtioning which require 3+ hours per day of interdisciplinary therapy in a comprehensive inpatient rehab setting. Physiatrist is providing close team supervision and 24 hour management of active medical problems listed below. Physiatrist and rehab team continue to assess barriers to discharge/monitor patient progress toward functional and medical goals. FIM: FIM - Bathing Bathing Steps Patient Completed: Chest;Left Arm;Abdomen;Front perineal area;Right lower leg (including foot);Right upper leg;Left upper leg Bathing: 3: Mod-Patient completes 5-7 34f 10 parts or 50-74%  FIM - Upper Body Dressing/Undressing Upper body dressing/undressing steps patient completed: Thread/unthread right sleeve of pullover shirt/dresss;Put head through opening of pull over shirt/dress;Pull shirt over trunk Upper body dressing/undressing: 4:  Min-Patient completed 75 plus % of tasks FIM - Lower Body Dressing/Undressing Lower body dressing/undressing steps patient completed: Thread/unthread right underwear leg;Pull underwear up/down;Thread/unthread right pants leg;Pull pants up/down;Don/Doff right shoe;Don/Doff left shoe;Fasten/unfasten right shoe;Fasten/unfasten left shoe Lower body dressing/undressing: 3: Mod-Patient completed 50-74% of tasks  FIM - Toileting Toileting steps completed by patient: Performs perineal hygiene;Adjust clothing after toileting Toileting Assistive Devices: Toilet Aid/prosthesis/orthosis Toileting: 0: Activity did not occur  FIM - Diplomatic Services operational officer Devices: Bedside commode;Grab bars Toilet Transfers: 3-To toilet/BSC: Mod A (lift or lower assist);3-From toilet/BSC: Mod A (lift or lower assist)  FIM - Bed/Chair Transfer Bed/Chair Transfer Assistive Devices: Arm rests Bed/Chair Transfer: 3: Supine > Sit: Mod A (lifting assist/Pt. 50-74%/lift 2 legs;4: Chair or W/C > Bed: Min A (steadying Pt. > 75%);4: Bed > Chair or W/C: Min A (steadying Pt. > 75%)  FIM - Locomotion: Wheelchair Locomotion: Wheelchair: 0: Activity did not occur FIM - Locomotion: Ambulation Locomotion: Ambulation Assistive Devices: Other (comment) (R hallway railing) Ambulation/Gait Assistance: 2: Max assist Locomotion: Ambulation: 1: Travels less than 50 ft with maximal assistance (Pt: 25 - 49%)  Comprehension Comprehension Mode: Auditory Comprehension: 5-Understands complex 90% of the time/Cues < 10% of the time  Expression Expression Mode: Verbal Expression: 5-Expresses basic needs/ideas: With extra time/assistive device  Social Interaction Social Interaction: 6-Interacts appropriately with others with medication or extra time (anti-anxiety, antidepressant).  Problem Solving Problem Solving: 5-Solves complex 90% of the time/cues < 10% of the time  Memory Memory: 5-Recognizes or recalls 90% of the  time/requires cueing < 10% of the time  Medical Problem List and Plan:  1. Right frontal parietal CVA after ERCP , spasticity an issue will add zanaflex 2. DVT Prophylaxis/Anticoagulation: SCDs. Monitor for any signs of DVT.  3. Acute non-ST segment elevation myocardial infarction. Cardiac cath unremarkable. No complaints of chest pain or shortness of breath. Continue medical management with aspirin and Xarelto, trying to get pt assistance  4. Obstructive jaundice/history ileostomy. Monitor LFTs and routine care of ileostomy. Continue Protonix  -pt,wife had been independent with ostomy care PTA  -serial labs  5. Hypertension. Lisinopril 10 mg daily and Lopressor 25 mg twice a day. Monitor with increased mobility hild lisinopril today for BP 100 systolic 6. PAF. Xarelto 20 mg daily. Cardiac rate controlled , monitor renal fx 6. Hyperlipidemia. Crestor  7. Dysphagia. Currently on dysphagia 3 nectar liquids. We'll have speech therapy followup  8. Tone: ROM, splinting, tizanidine 9.  Leukocytosis likely secondary to commonbile duct obstruction , afeb, monitor WBC cont ceftin 10. Insomnia change ambien to CR formulation  LOS (Days) 8 A FACE TO FACE EVALUATION WAS PERFORMED  Shawn Wiley E 10/28/2011, 8:50 AM

## 2011-10-28 NOTE — Progress Notes (Signed)
Occupational Therapy Note  Patient Details  Name: Shawn Wiley MRN: [id number] Date of Birth: 1935/09/15 Today's Date: 10/28/2011 Time:  1500-1600  ( ) Individual therapy Pain:   None  Engaged in LUE neuromuscular reducation.  Performed supine on mat with movements noted in elbow flexion and extension with gravity eliminated;  shoulder flexion with elbow flexion with quick jerky movements going from flex to extend; wrist extension with maximum cues for control and to slow down; forearm pronation but no supination. Shoulder extension with arm held at 90 degrees shoulder flexion with gravity eliminated with associated reactions in LLE.  Pt. Transferred from wc to mat and back with mod to min assist.     Humberto Seals 10/28/2011, 3:29 PM

## 2011-10-29 NOTE — Progress Notes (Signed)
Patient ID: Shawn Wiley, male   DOB: March 07, 1936, 76 y.o.   MRN: [id number]  Subjective/Complaints: Slept well "L arm pulls to the R" Review of Systems  Respiratory: Positive for cough and sputum production. Negative for hemoptysis, shortness of breath and wheezing.   All other systems reviewed and are negative.   Objective: Vital Signs: Blood pressure 112/73, pulse 68, temperature 98.4 F (36.9 C), temperature source Oral, resp. rate 18, height 5\' 10"  (1.778 m), weight 80.5 kg (177 lb 7.5 oz), SpO2 97.00%. No results found. No results found for this or any previous visit (from the past 72 hour(s)).   HEENT: normal Cardio: RRR Resp: CTA B/L GI: BS positive and RLQ colostomy Extremity:  No Edema Skin:   Intact Neuro: Alert/Oriented, Abnormal Sensory tingling with Lt touch LUE and LLE and Abnormal Motor 3-/5 with flexor synergy LUE, 2-/5 with ext synergy LLE except 0/5 L ankle Musc/Skel:  Normal Tone ashworth 3 at triceps, 2 in biceps and finger/wrist flexors  Assessment/Plan: 1. Functional deficits secondary to R frontal and pariental infarct as well as decondtioning which require 3+ hours per day of interdisciplinary therapy in a comprehensive inpatient rehab setting. Physiatrist is providing close team supervision and 24 hour management of active medical problems listed below. Physiatrist and rehab team continue to assess barriers to discharge/monitor patient progress toward functional and medical goals. FIM: FIM - Bathing Bathing Steps Patient Completed: Chest;Right Arm;Left lower leg (including foot);Left Arm;Abdomen;Front perineal area;Buttocks;Right upper leg;Right lower leg (including foot);Left upper leg (pt. had a shower) Bathing: 4: Steadying assist  FIM - Upper Body Dressing/Undressing Upper body dressing/undressing steps patient completed: Thread/unthread right sleeve of pullover shirt/dresss;Thread/unthread left sleeve of pullover shirt/dress;Put head through opening  of pull over shirt/dress;Pull shirt over trunk Upper body dressing/undressing: 4: Min-Patient completed 75 plus % of tasks FIM - Lower Body Dressing/Undressing Lower body dressing/undressing steps patient completed: Pull pants up/down Lower body dressing/undressing: 2: Max-Patient completed 25-49% of tasks  FIM - Toileting Toileting steps completed by patient: Performs perineal hygiene;Adjust clothing after toileting Toileting Assistive Devices: Toilet Aid/prosthesis/orthosis Toileting: 0: Activity did not occur  FIM - Diplomatic Services operational officer Devices: Bedside commode;Grab bars Toilet Transfers: 3-To toilet/BSC: Mod A (lift or lower assist);3-From toilet/BSC: Mod A (lift or lower assist)  FIM - Press photographer Assistive Devices: Arm rests Bed/Chair Transfer: 3: Chair or W/C > Bed: Mod A (lift or lower assist)  FIM - Locomotion: Wheelchair Locomotion: Wheelchair: 0: Activity did not occur FIM - Locomotion: Ambulation Locomotion: Ambulation Assistive Devices: Other (comment) (R hallway railing) Ambulation/Gait Assistance: 2: Max assist Locomotion: Ambulation: 1: Travels less than 50 ft with maximal assistance (Pt: 25 - 49%)  Comprehension Comprehension Mode: Auditory Comprehension: 5-Understands basic 90% of the time/requires cueing < 10% of the time  Expression Expression Mode: Verbal Expression: 5-Expresses basic needs/ideas: With extra time/assistive device  Social Interaction Social Interaction: 6-Interacts appropriately with others with medication or extra time (anti-anxiety, antidepressant).  Problem Solving Problem Solving: 5-Solves basic 90% of the time/requires cueing < 10% of the time  Memory Memory: 5-Recognizes or recalls 90% of the time/requires cueing < 10% of the time  Medical Problem List and Plan:  1. Right frontal parietal CVA after ERCP , spasticity an issue will add zanaflex 2. DVT Prophylaxis/Anticoagulation:  SCDs. Monitor for any signs of DVT.  3. Acute non-ST segment elevation myocardial infarction. Cardiac cath unremarkable. No complaints of chest pain or shortness of breath. Continue medical management with aspirin and Xarelto,  trying to get pt assistance  4. Obstructive jaundice/history ileostomy. Monitor LFTs and routine care of ileostomy. Continue Protonix  -pt,wife had been independent with ostomy care PTA  -serial labs  5. Hypertension. Lisinopril 10 mg daily and Lopressor 25 mg twice a day. Monitor with increased mobility hild lisinopril today for BP 100 systolic 6. PAF. Xarelto 20 mg daily. Cardiac rate controlled , monitor renal fx 6. Hyperlipidemia. Crestor  7. Dysphagia. Currently on dysphagia 3 nectar liquids. We'll have speech therapy followup  8. Tone: ROM, splinting, tizanidine 9.  Leukocytosis likely secondary to common bile duct obstruction , afeb, monitor WBC cont ceftin 10. Insomnia change ambien to CR formulation  LOS (Days) 9 A FACE TO FACE EVALUATION WAS PERFORMED  Nimai Burbach E 10/29/2011, 8:34 AM

## 2011-10-29 NOTE — Progress Notes (Signed)
Physical Therapy Session Note  Patient Details  Name: Shawn Wiley MRN: [id number] Date of Birth: 29-Nov-1935  Today's Date: 10/29/2011 Time: 1017-1100 Time Calculation (min): 43 min  Short Term Goals: Week 2:  PT Short Term Goal 1 (Week 2): Pt will perform bed mobility with min assist. PT Short Term Goal 2 (Week 2): pt will perform transfer to R with supervision. PT Short Term Goal 3 (Week 2): Pt will propel w/c x 150' with supervision. PT Short Term Goal 4 (Week 2): Pt will perform gait x 35' with min/mod assist consistently, with LRAD.    Skilled Therapeutic Interventions/Progress Updates:    NMR to increase control LLE for mobility (rolling, transfers, gait and steps) and in standing weight bearing to manage tone in ankle and increase pt attention and ability to sustain hip and knee extension.  Once open chain activity with LLE began, put on AFO to aid DF/toe clearance. Pt's wife had many questions regarding brace which were answered. Finished session with gait with 2 person HHA but pt mod A 10 ft. Pt w/c not deep enough and with foot propulsion he slides forward in chair and needs to stop and scoot back every few feet. Therapy Documentation Precautions:  Precautions Precautions: Fall Precaution Comments: abnormal tone LLE Restrictions Weight Bearing Restrictions: No Vital Signs:  68 Pain: Pain Assessment Pain Assessment:  (some L shoulder pain) Pain Intervention(s): Repositioned (educated on positioning and given handout)    Locomotion : Stairs / Additional Locomotion Stairs: Yes Stairs Assistance: 3: Mod assist Stairs Assistance Details (indicate cue type and reason): L toe off brace, 5 with mod A with assist to raise LLE and prevent adduction, second 5 with min A and pt able to raise and place LLE d/t improved LLE control   See FIM for current functional status  Therapy/Group: Individual Therapy  Michaelene Song 10/29/2011, 11:43 AM

## 2011-10-30 DIAGNOSIS — Z5189 Encounter for other specified aftercare: Secondary | ICD-10-CM

## 2011-10-30 DIAGNOSIS — I6330 Cerebral infarction due to thrombosis of unspecified cerebral artery: Secondary | ICD-10-CM

## 2011-10-30 DIAGNOSIS — G8110 Spastic hemiplegia affecting unspecified side: Secondary | ICD-10-CM

## 2011-10-30 NOTE — Progress Notes (Signed)
Occupational Therapy Session Note  Patient Details  Name: Shawn Wiley MRN: [id number] Date of Birth: 11-19-1935  Today's Date: 10/30/2011 Time: 0830-0930 Time Calculation (min): 60 min  Short Term Goals: Week 1:  OT Short Term Goal 1 (Week 1): Patient will complete transfers functional transfers with min assist OT Short Term Goal 2 (Week 1): Patient will complete upper body bathing with supervision and setup OT Short Term Goal 3 (Week 1): Patient will complete lower body bathing with min assist OT Short Term Goal 4 (Week 1): Patient will complete upper body dressing with min assist OT Short Term Goal 5 (Week 1): Patient will complete lower body dressing with moderate assist  Skilled Therapeutic Interventions/Progress Updates:  Patient completed ADL in shower this AM. No reports of pain. Shower bench was turned to face out of shower because patient did not feel he had enough room with bench facing left wall of shower. Patient required Mod vc's during stand pivot transfer in and out of walk-in shower for foot placement and technique. Patient's left leg present with hypertonicity during transfers/standing. Patient dressed at sink and was educated on hemi-dressing techniques with LUE. Patient performed dressing tasks well with constant vc's for technique and motivation. Patient able to grasp deodorant with left hand and therapist provided assist to lift arm up to right armpit and apply deodorant.    Therapy Documentation Precautions:  Precautions Precautions: Fall Precaution Comments: abnormal tone LLE Restrictions Weight Bearing Restrictions: No  Pain: Pain Assessment Pain Assessment: No/denies pain Pain Score: 0-No pain  See FIM for current functional status  Therapy/Group: Individual Therapy  Aulton Routt, Charisse March 10/30/2011, 10:58 AM

## 2011-10-30 NOTE — Progress Notes (Signed)
Physical Therapy Note  Patient Details  Name: [name] MRN: [id number] Date of Birth: 1935-08-28 Today's Date: 10/30/2011  14:15-14:45 individual therapy pt denied pain.  Squat tranfers both ways min assist with vc for technique and approximation to left knee. Performed multiple sqats with reaching and weight shifts for strengthening while maintaining alignment and reaching with rotation including reaching while weightbearing on left shoulder with min assist.   Julian Reil 10/30/2011, 4:39 PM

## 2011-10-30 NOTE — Progress Notes (Signed)
Patient ID: Shawn Wiley, male   DOB: 1935/05/28, 75 y.o.   MRN: [id number]  Subjective/Complaints: Slept well "L arm pulls to the R" Review of Systems  Respiratory: Positive for cough and sputum production. Negative for hemoptysis, shortness of breath and wheezing.   All other systems reviewed and are negative.   Objective: Vital Signs: Blood pressure 96/58, pulse 60, temperature 98.3 F (36.8 C), temperature source Oral, resp. rate 18, height 5\' 10"  (1.778 m), weight 80.5 kg (177 lb 7.5 oz), SpO2 97.00%. No results found. No results found for this or any previous visit (from the past 72 hour(s)).   HEENT: normal Cardio: RRR Resp: CTA B/L GI: BS positive and RLQ colostomy Extremity:  No Edema Skin:   Intact Neuro: Alert/Oriented, Abnormal Sensory tingling with Lt touch LUE and LLE and Abnormal Motor 3-/5 with flexor synergy LUE, 2-/5 with ext synergy LLE except 0/5 L ankle Musc/Skel:  Normal Tone ashworth 3 at triceps, 2 in biceps and finger/wrist flexors  Assessment/Plan: 1. Functional deficits secondary to R frontal and pariental infarct as well as decondtioning which require 3+ hours per day of interdisciplinary therapy in a comprehensive inpatient rehab setting. Physiatrist is providing close team supervision and 24 hour management of active medical problems listed below. Physiatrist and rehab team continue to assess barriers to discharge/monitor patient progress toward functional and medical goals. FIM: FIM - Bathing Bathing Steps Patient Completed: Chest;Left Arm;Abdomen;Right upper leg;Left upper leg;Right lower leg (including foot);Left lower leg (including foot) Bathing: 3: Mod-Patient completes 5-7 64f 10 parts or 50-74%  FIM - Upper Body Dressing/Undressing Upper body dressing/undressing steps patient completed:  (wife helped in dressing) Upper body dressing/undressing: 4: Min-Patient completed 75 plus % of tasks FIM - Lower Body Dressing/Undressing Lower body  dressing/undressing steps patient completed:  (wife helped in dressing) Lower body dressing/undressing: 2: Max-Patient completed 25-49% of tasks  FIM - Toileting Toileting steps completed by patient: Performs perineal hygiene;Adjust clothing after toileting Toileting Assistive Devices: Toilet Aid/prosthesis/orthosis Toileting: 0: Activity did not occur  FIM - Diplomatic Services operational officer Devices: Bedside commode;Grab bars Toilet Transfers: 3-To toilet/BSC: Mod A (lift or lower assist);3-From toilet/BSC: Mod A (lift or lower assist)  FIM - Press photographer Assistive Devices: Arm rests Bed/Chair Transfer: 3: Bed > Chair or W/C: Mod A (lift or lower assist);3: Chair or W/C > Bed: Mod A (lift or lower assist)  FIM - Locomotion: Wheelchair Locomotion: Wheelchair: 1: Travels less than 50 ft with supervision, cueing or coaxing FIM - Locomotion: Ambulation Locomotion: Ambulation Assistive Devices: Other (comment) (R hallway railing) Ambulation/Gait Assistance: 2: Max assist Locomotion: Ambulation: 1: Two helpers  Comprehension Comprehension Mode: Auditory Comprehension: 5-Follows basic conversation/direction: With extra time/assistive device  Expression Expression Mode: Verbal Expression: 5-Expresses basic needs/ideas: With no assist  Social Interaction Social Interaction: 6-Interacts appropriately with others with medication or extra time (anti-anxiety, antidepressant).  Problem Solving Problem Solving: 5-Solves basic 90% of the time/requires cueing < 10% of the time  Memory Memory: 5-Recognizes or recalls 90% of the time/requires cueing < 10% of the time  Medical Problem List and Plan:  1. Right frontal parietal CVA after ERCP , spasticity an issue will add zanaflex 2. DVT Prophylaxis/Anticoagulation: SCDs. Monitor for any signs of DVT.  3. Acute non-ST segment elevation myocardial infarction. Cardiac cath unremarkable. No complaints of chest  pain or shortness of breath. Continue medical management with aspirin and Xarelto, trying to get pt assistance  4. Obstructive jaundice/history ileostomy. Monitor LFTs and routine  care of ileostomy. Continue Protonix  -pt,wife had been independent with ostomy care PTA  -serial labs  5. Hypertension. Lisinopril 10 mg daily and Lopressor 25 mg twice a day. Monitor with increased mobility hild lisinopril today for BP 100 systolic 6. PAF. Xarelto 20 mg daily. Cardiac rate controlled , monitor renal fx 6. Hyperlipidemia. Crestor  7. Dysphagia. Currently on dysphagia 3 nectar liquids. We'll have speech therapy followup  8. Tone: ROM, splinting, tizanidine improved clonus but developing L ankle contracture needs PRAFO 9.  Leukocytosis likely secondary to common bile duct obstruction , afeb, monitor WBC completed ceftin 10. Insomnia improved on elavil  LOS (Days) 10 A FACE TO FACE EVALUATION WAS PERFORMED  Ola Raap E 10/30/2011, 8:02 AM

## 2011-10-30 NOTE — Progress Notes (Signed)
Per State Regulation 482.30 This chart was reviewed for medical necessity with respect to the patient's Admission/Duration of stay. Participating in therapies with slow, steady progress. Meryl Dare                 Nurse Care Manager            Next Review Date: 11/03/11

## 2011-10-30 NOTE — Progress Notes (Signed)
Speech Language Pathology Daily Session Note  Patient Details  Name: Shawn Wiley MRN: [id number] Date of Birth: Jul 01, 1935  Today's Date: 10/30/2011 Time: 1018-1100 Time Calculation (min): 42 min  Short Term Goals: Week 2: SLP Short Term Goal 1 (Week 2): Pt will demonstrate functional problem solving for basic tasks with supervision cues SLP Short Term Goal 1 - Progress (Week 2): Progressing toward goal SLP Short Term Goal 2 (Week 2): Pt will utilize speech intelligibility strategies at conversational level with mod I verbal cues. SLP Short Term Goal 2 - Progress (Week 2): Progressing toward goal SLP Short Term Goal 3 (Week 2): Pt will tolerate trials of soft mechanical consistencies with mod I. SLP Short Term Goal 3 - Progress (Week 2): Progressing toward goal  Skilled Therapeutic Interventions: Pt. seen for speech, dysphagia and cognition.  Food Paramedic present when SLP arrived and discussing pt.'s diet.  Apparently there was a misunderstanding between pt. and ambassador as to the desired consistency of his meats.  Pt's wife arrived and discussed that she chops his meats at home but all side items are regular texture.  It was agreed upon to upgrade diet to Dys 3 and specify "chop meats" in comment section.  Pt. required supervision cues for bolus formation and cleaning oral cavity post swallow of cookies.  He pointed to speech strategy sign on wall when asked about speech intelligibility and needed min verbal cues to take deep breaths prior to phonation during singing along with his CD player.     FIM:  Comprehension Comprehension: 5-Understands basic 90% of the time/requires cueing < 10% of the time Expression Expression Mode: Verbal Expression: 5-Expresses basic 90% of the time/requires cueing < 10% of the time. Social Interaction Social Interaction: 5-Interacts appropriately 90% of the time - Needs monitoring or encouragement for participation or interaction. Problem  Solving Problem Solving: 4-Solves basic 75 - 89% of the time/requires cueing 10 - 24% of the time Memory Memory: 4-Recognizes or recalls 75 - 89% of the time/requires cueing 10 - 24% of the time FIM - Eating Eating Activity: 5: Supervision/cues  Pain Pain Assessment Pain Assessment: No/denies pain Pain Score: 0-No pain  Therapy/Group: Individual Therapy  Royce Macadamia 10/30/2011, 11:24 AM

## 2011-10-30 NOTE — Progress Notes (Signed)
Physical Therapy Note  Patient Details  Name: Shawn Wiley MRN: [id number] Date of Birth: [birth year] Today's Date: 10/30/2011  11:00-12:00 individual therapy pt denied pain  Met with orthotist for AFO casting. Performed gait x 20' with rw and hand splint max assist with facilitation at hip to reduce external rotation and to provide downward pressure For stance to stretch calf, pt had to perform excessive hip flexion due to foot drop. Stand pivot transfer to left max to total assist for balance and foot placement. Sit to side mod assist.  Julian Reil 10/30/2011, 1:43 PM

## 2011-10-31 NOTE — Progress Notes (Signed)
Speech Language Pathology Daily Session Note  Patient Details  Name: Shawn Wiley MRN: [id number] Date of Birth: 06-04-1935  Today's Date: 10/31/2011 Time: 1330-1400 Time Calculation (min): 30 min  Short Term Goals: Week 2: SLP Short Term Goal 1 (Week 2): Pt will demonstrate functional problem solving for basic tasks with supervision cues SLP Short Term Goal 1 - Progress (Week 2): Progressing toward goal SLP Short Term Goal 2 (Week 2): Pt will utilize speech intelligibility strategies at conversational level with mod I verbal cues. SLP Short Term Goal 2 - Progress (Week 2): Met SLP Short Term Goal 3 (Week 2): Pt will tolerate trials of soft mechanical consistencies with mod I. SLP Short Term Goal 3 - Progress (Week 2): Partly met  Skilled Therapeutic Interventions: ST focused on facilitation of cognitive abilities and dysphagia.  Provided continued education regarding pt's diet upgrade to Dys 3 yesterday.  He reported the chicken was too mushy in the chicken and dumplings at lunch, however the beef was chopped apporpriately on tray last night.  It appears that he can masticate and transit Dys 3 texture without difficulty.  Pt. required min-supervision cues for problem solving during activities with playing cards.  Pt. was 100% intelligible in conversation.     FIM:  Comprehension Comprehension Mode: Auditory Comprehension: 5-Understands complex 90% of the time/Cues < 10% of the time Expression Expression Mode: Verbal Expression: 6-Expresses complex ideas: With extra time/assistive device Social Interaction Social Interaction: 5-Interacts appropriately 90% of the time - Needs monitoring or encouragement for participation or interaction. Problem Solving Problem Solving: 5-Solves basic 90% of the time/requires cueing < 10% of the time Memory Memory: 4-Recognizes or recalls 75 - 89% of the time/requires cueing 10 - 24% of the time FIM - Eating Eating Activity: 5:  Supervision/cues  Pain Pain Assessment Pain Assessment: No/denies pain  Therapy/Group: Individual Therapy  Royce Macadamia 10/31/2011, 2:53 PM

## 2011-10-31 NOTE — Progress Notes (Signed)
Patient tolerated PRAFO from 2100-0530 last night; complains that it restricts his ability to turn.  Reinforced need for [facility] and encouraged patient to call for assistance with bed mobility if needed while PRAFO in place.  Will continue to monitor.

## 2011-10-31 NOTE — Progress Notes (Signed)
Patient ID: KENDRE JACINTO, male   DOB: 1936/01/28, 76 y.o.   MRN: [id number]  Subjective/Complaints: Slept well "L arm pulls to the R" I can close my hand but not open L hand Review of Systems  Respiratory: Positive for cough and sputum production. Negative for hemoptysis, shortness of breath and wheezing.   All other systems reviewed and are negative.   Objective: Vital Signs: Blood pressure 110/71, pulse 75, temperature 98 F (36.7 C), temperature source Oral, resp. rate 18, height 5\' 10"  (1.778 m), weight 78.2 kg (172 lb 6.4 oz), SpO2 98.00%. No results found. No results found for this or any previous visit (from the past 72 hour(s)).   HEENT: normal Cardio: RRR Resp: CTA B/L GI: BS positive and RLQ colostomy Extremity:  No Edema Skin:   Intact Neuro: Alert/Oriented, Abnormal Sensory tingling with Lt touch LUE and LLE and Abnormal Motor 3-/5 with flexor synergy LUE, 2-/5 with ext synergy LLE except 0/5 L ankle Musc/Skel:  Normal Tone ashworth 3 at triceps, 2 in biceps and finger/wrist flexors  Assessment/Plan: 1. Functional deficits secondary to R frontal and pariental infarct as well as decondtioning which require 3+ hours per day of interdisciplinary therapy in a comprehensive inpatient rehab setting. Physiatrist is providing close team supervision and 24 hour management of active medical problems listed below. Physiatrist and rehab team continue to assess barriers to discharge/monitor patient progress toward functional and medical goals. FIM: FIM - Bathing Bathing Steps Patient Completed: Chest;Right Arm;Left Arm;Abdomen;Front perineal area;Buttocks;Right upper leg;Left upper leg;Right lower leg (including foot);Left lower leg (including foot) Bathing: 3: Mod-Patient completes 5-7 14f 10 parts or 50-74%  FIM - Upper Body Dressing/Undressing Upper body dressing/undressing steps patient completed: Thread/unthread right sleeve of pullover shirt/dresss;Thread/unthread left sleeve  of pullover shirt/dress;Put head through opening of pull over shirt/dress;Pull shirt over trunk Upper body dressing/undressing: 4: Min-Patient completed 75 plus % of tasks FIM - Lower Body Dressing/Undressing Lower body dressing/undressing steps patient completed: Thread/unthread right underwear leg;Thread/unthread left underwear leg;Pull underwear up/down;Thread/unthread right pants leg;Thread/unthread left pants leg;Pull pants up/down;Don/Doff right shoe;Don/Doff left shoe;Fasten/unfasten right shoe;Fasten/unfasten left shoe Lower body dressing/undressing: 4: Min-Patient completed 75 plus % of tasks  FIM - Toileting Toileting steps completed by patient: Performs perineal hygiene;Adjust clothing after toileting Toileting Assistive Devices: Toilet Aid/prosthesis/orthosis Toileting: 0: Activity did not occur  FIM - Diplomatic Services operational officer Devices: Bedside commode;Grab bars Toilet Transfers: 3-To toilet/BSC: Mod A (lift or lower assist);3-From toilet/BSC: Mod A (lift or lower assist)  FIM - Press photographer Assistive Devices: Arm rests Bed/Chair Transfer: 3: Bed > Chair or W/C: Mod A (lift or lower assist);3: Chair or W/C > Bed: Mod A (lift or lower assist)  FIM - Locomotion: Wheelchair Locomotion: Wheelchair: 1: Travels less than 50 ft with supervision, cueing or coaxing FIM - Locomotion: Ambulation Locomotion: Ambulation Assistive Devices: Other (comment) (R hallway railing) Ambulation/Gait Assistance: 2: Max assist Locomotion: Ambulation: 1: Two helpers  Comprehension Comprehension Mode: Auditory Comprehension: 5-Follows basic conversation/direction: With extra time/assistive device  Expression Expression Mode: Verbal Expression: 5-Expresses basic needs/ideas: With no assist  Social Interaction Social Interaction: 5-Interacts appropriately 90% of the time - Needs monitoring or encouragement for participation or interaction.  Problem  Solving Problem Solving: 5-Solves basic 90% of the time/requires cueing < 10% of the time  Memory Memory: 4-Recognizes or recalls 75 - 89% of the time/requires cueing 10 - 24% of the time  Medical Problem List and Plan:  1. Right frontal parietal CVA after ERCP ,  spasticity an issue will add zanaflex 2. DVT Prophylaxis/Anticoagulation: SCDs. Monitor for any signs of DVT.  3. Acute non-ST segment elevation myocardial infarction. Cardiac cath unremarkable. No complaints of chest pain or shortness of breath. Continue medical management with aspirin and Xarelto, trying to get pt assistance  4. Obstructive jaundice/history ileostomy. Monitor LFTs and routine care of ileostomy. Continue Protonix  -pt,wife had been independent with ostomy care PTA  -serial labs  5. Hypertension. Lisinopril 10 mg daily and Lopressor 25 mg twice a day. Monitor with increased mobility hild lisinopril today for BP 100 systolic 6. PAF. Xarelto 20 mg daily. Cardiac rate controlled , monitor renal fx 6. Hyperlipidemia. Crestor  7. Dysphagia. Currently on dysphagia 3 nectar liquids. We'll have speech therapy followup  8. Tone: ROM, splinting, tizanidine improved clonus but developing L ankle contracture needs PRAFO 9.  Leukocytosis likely secondary to common bile duct obstruction , afeb, monitor WBC completed ceftin 10. Insomnia improved on elavil  LOS (Days) 11 A FACE TO FACE EVALUATION WAS PERFORMED  Jocelyne Reinertsen E 10/31/2011, 8:28 AM

## 2011-10-31 NOTE — Progress Notes (Signed)
Occupational Therapy Session Note  Patient Details  Name: DALESSANDRO BALDYGA MRN: [id number] Date of Birth: 05/09/1935  Today's Date: 10/31/2011 Time: 9798-9211 Time Calculation (min): 45 min  Short Term Goals: Week 1:  OT Short Term Goal 1 (Week 1): Patient will complete transfers functional transfers with min assist OT Short Term Goal 2 (Week 1): Patient will complete upper body bathing with supervision and setup OT Short Term Goal 3 (Week 1): Patient will complete lower body bathing with min assist OT Short Term Goal 4 (Week 1): Patient will complete upper body dressing with min assist OT Short Term Goal 5 (Week 1): Patient will complete lower body dressing with moderate assist  Skilled Therapeutic Interventions:  Patient in W/C upon arrival and indicated he would prefer to finish our therapy session with him resting in bed as I was his last therapy for the day.  Practiced squat pivot transfer w/c>bed with squat pivot and vc's for left foot placement and to remain in a squat position as patient tends to attempt full stand instead of squat.  Scooting forward, back, left and right required vc's to also scoot left hip with scoots.  Sit><squat 3 X's to include weight shifts onto LLE while in squat position with min-mod assist.  Sit>supine using an adaptive technique with supervision. Practiced bed positioning on left side with weight bearing into left scapula.  LUE P/AA/AROM with focus on slow controlled movements.  Patient was able to control his movements with vc's to watch his arm/hand while it is at work.    Precautions:  Precautions Precautions: Fall Precaution Comments: abnormal LUE & LLE tone Restrictions Weight Bearing Restrictions: No  Pain: Pain Assessment Pain Assessment: No/denies pain  See FIM for current functional status  Therapy/Group: Individual Therapy and wife present last 15 minutes to observe.  Harlie Ragle 10/31/2011, 3:55 PM

## 2011-10-31 NOTE — Progress Notes (Addendum)
Physical Therapy Session Note  Patient Details  Name: Shawn Wiley MRN: [id number] Date of Birth: 1935/08/09  Today's Date: 10/31/2011 Time: 4098-1191 Time Calculation (min): 55 min  Short Term Goals:  Week 2:  PT Short Term Goal 1 (Week 2): Pt will perform bed mobility with min assist. PT Short Term Goal 2 (Week 2): pt will perform transfer to R with supervision. PT Short Term Goal 3 (Week 2): Pt will propel w/c x 150' with supervision. PT Short Term Goal 4 (Week 2): Pt will perform gait x 35' with min/mod assist consistently, with LRAD.    Skilled Therapeutic Interventions/Progress Updates: neuromuscular re-education via forced use, demo, VCs and manual cues; mobility  W/c> mat stand/pivot to R with min assist, to L with mod assist, focusing on slowing down to allow LLE muscle activation.  neuromuscular re-education for LLE muscle grading/timing with HEP in supine of lower trunk rotation, bil bridging, LLE bridging x 10-15 each, working on sustained contractions, symmetrical movements  Gait training with RW , L knee cage, L ACE wrap on ankle, with L hand orthosis, x 52' with mod assist, 1 instance of max assist to prevent fall due to shifting wt to L before placing LLE.  Focus on upright stance, wider BOs, L hip flexion and extension with approximation through L pelvis to increase hip extension and decrease ER.  Bed mobility focusing on technique to include LLE.  Progressed from min assist with max cues to supervision without cues for L sidelying>< sitting.  W/c propulsion using hemi method, x 157',  Supervision> min assist when fatigued.       Therapy Documentation Precautions:  Precautions Precautions: Fall Precaution Comments: abnormal LUE & LLE tone Restrictions Weight Bearing Restrictions: No   Pain: Pain Assessment Pain Assessment: No/denies pain        See FIM for current functional status  Therapy/Group: Individual Therapy  Olanna Percifield 10/31/2011, 3:56 PM

## 2011-10-31 NOTE — Progress Notes (Signed)
Nutrition Follow-up  Intervention:   1.RD to continue to follow nutrition care plan  Assessment: Diet Order:  Dysphagia 3 with thin liquids  Pt advanced to Dysphagia 3 diet on 7/1. Pt is now consistently eating 100% of meals. Weights stable with admission weight.  Meds: Scheduled Meds:    . amitriptyline  25 mg Oral QHS  . antiseptic oral rinse  15 mL Mouth Rinse BID  . aspirin EC  81 mg Oral Daily  . atorvastatin  20 mg Oral q1800  . B-complex with vitamin C  1 tablet Oral Daily  . lisinopril  10 mg Oral Daily  . metoprolol tartrate  25 mg Oral BID  . multivitamins with iron  1 tablet Oral Daily  . pantoprazole  40 mg Oral Daily  . rivaroxaban  20 mg Oral QPC supper  . tiZANidine  2 mg Oral TID  . traZODone  50 mg Oral Once   Continuous Infusions:  PRN Meds:.acetaminophen, diphenhydrAMINE, nitroGLYCERIN, ondansetron (ZOFRAN) IV, ondansetron, polyethylene glycol, sorbitol  Labs:  CMP     Component Value Date/Time   NA 129* 10/23/2011 0600   K 4.7 10/23/2011 0600   CL 93* 10/23/2011 0600   CO2 26 10/23/2011 0600   GLUCOSE 99 10/23/2011 0600   BUN 16 10/23/2011 0600   CREATININE 0.88 10/23/2011 0600   CALCIUM 10.0 10/23/2011 0600   PROT 6.9 10/23/2011 0600   ALBUMIN 3.3* 10/23/2011 0600   AST 42* 10/23/2011 0600   ALT 55* 10/23/2011 0600   ALKPHOS 161* 10/23/2011 0600   BILITOT 1.7* 10/23/2011 0600   GFRNONAA 83* 10/23/2011 0600   GFRAA >90 10/23/2011 0600     Intake/Output Summary (Last 24 hours) at 10/31/11 1329 Last data filed at 10/31/11 0809  Gross per 24 hour  Intake    960 ml  Output   3001 ml  Net  -2041 ml  BM on 7/2  Weight Status:  78.2 kg - consistent with admit wt  Estimated needs:  1785 - 2000 kcal, 82 - 98 grams protein  Nutrition Dx:  Increased nutrient needs r/t participation in rehab therapies AEB estimated needs. Ongoing.  Goal:  Pt to consume at least 90% of estimated needs - met.  Monitor:  weights, labs, PO intake, I/O's  Jarold Motto MS,  RD, LDN Pager: [phone] After-hours pager: [phone]

## 2011-10-31 NOTE — Progress Notes (Signed)
Occupational Therapy Session Note  Patient Details  Name: Shawn Wiley MRN: [id number] Date of Birth: Mar 29, 1936  Today's Date: 10/31/2011 Time: 0830-0930 Time Calculation (min): 60 min  Short Term Goals: Week 1:  OT Short Term Goal 1 (Week 1): Patient will complete transfers functional transfers with min assist OT Short Term Goal 2 (Week 1): Patient will complete upper body bathing with supervision and setup OT Short Term Goal 3 (Week 1): Patient will complete lower body bathing with min assist OT Short Term Goal 4 (Week 1): Patient will complete upper body dressing with min assist OT Short Term Goal 5 (Week 1): Patient will complete lower body dressing with moderate assist  Skilled Therapeutic Interventions/Progress Updates:  ADL performed in shower. Patient improved in shower transfer. Patient required vc's for technique and physical assistance to move LLE during stand pivot transfer. Patient dressed at sink. Re-educated on hemi-dressing technique. Patient required increased time and verbal cues to complete.  P/ROM; LUE; 10 reps; 1 set; all ranges as tolerated. No pain noted. A/ROM; LUE: hand ranges; pronation/supination; elbow flexion/extension; scapular protraction/retraction; 5 reps; 1 set; vc's for technique and motivation. Attempted shoulder elevation with tapping to inhibit muscle contract; no results. Patient given yellow theraputty HEP with handouts. Reviewed exercises with visual demo. Patient verbalized understanding.    Therapy Documentation Precautions:  Precautions Precautions: Fall Precaution Comments: abnormal tone LLE Restrictions Weight Bearing Restrictions: No Pain: No reports of pain. See FIM for current functional status  Therapy/Group: Individual Therapy  Story Conti, Charisse March 10/31/2011, 11:24 AM

## 2011-11-01 MED ORDER — BACITRACIN ZINC 500 UNIT/GM EX OINT
1.0000 "application " | TOPICAL_OINTMENT | Freq: Two times a day (BID) | CUTANEOUS | Status: DC
  Administered 2011-11-02 – 2011-11-07 (×12): 1 via TOPICAL
  Filled 2011-11-01 (×2): qty 0.9
  Filled 2011-11-01: qty 15
  Filled 2011-11-01: qty 0.9

## 2011-11-01 NOTE — Progress Notes (Signed)
Social Work Patient ID: Shawn Wiley, male   DOB: [birth year], 76 y.o.   MRN: [id number] Met with pt and wife to inform team conference progression toward goals and discharge 7/12. Both pleased with his progress but would like it  to be faster.  Wife hopes to go to the beach in a month And feels this is a realistic goal for them.  Discussed follow up and DME and will get closer to discharge. Wife here daily and participates in pt's care.

## 2011-11-01 NOTE — Progress Notes (Signed)
Physical Therapy Note  Patient Details  Name: Shawn Wiley MRN: [id number] Date of Birth: [birth year] Today's Date: 11/01/2011  1545-1640 (55 minutes) individual Pain: No complaint of pain Focus of treatment: LT LE strengthening; Gait training with/without AD : wc mobility training Treatment: Sit to stand min assist for balance with vcs for hand placement; Gait with RW + LT hand orthosis + LT AFO min/mod assist  ( . Pt advancing LT LE inconsistently with assist to maintain balance and to guide AD) 50 feet X 2. Gait without AD mod assist for balance 2 X 20 feet. Up/down 4 inch step for quad strengthening/control on Left.; wc mobility - to /from room SBA (120 feet, level) using rt hemi technique.   Edelmira Gallogly,JIM 11/01/2011, 4:43 PM

## 2011-11-01 NOTE — Progress Notes (Signed)
Occupational Therapy Weekly Progress Note and Treatment Session  Patient Details  Name: Shawn Wiley MRN: [id number] Date of Birth: 13-Sep-1935  Today's Date: 11/01/2011 Time: 0900-1000 Time Calculation (min): 60 min  Patient has met 3 of 5 short term goals.    Patient continues to demonstrate the following deficits: decrease ADL performance, functional transfers, and decrease A/ROM of L UE and therefore will continue to benefit from skilled OT intervention to enhance overall performance with BADL.  Patient progressing toward long term goals..  Continue plan of care.  OT Short Term Goals Week 1:  OT Short Term Goal 1 (Week 1): Patient will complete transfers functional transfers with min assist  OT Short Term Goal 1 - Progress (Week 1): Progressing toward goal OT Short Term Goal 2 (Week 1): Patient will complete upper body bathing with supervision and setup  OT Short Term Goal 2 - Progress (Week 1): Progressing toward goal OT Short Term Goal 3 (Week 1): Patient will complete lower body bathing with min assist  OT Short Term Goal 3 - Progress (Week 1): Met OT Short Term Goal 4 (Week 1): Patient will complete upper body dressing with min assist  OT Short Term Goal 4 - Progress (Week 1): Met OT Short Term Goal 5 (Week 1): Patient will complete lower body dressing with moderate assist OT Short Term Goal 5 - Progress (Week 1): Met Week 2:  OT Short Term Goal 1 (Week 2): Patient will complete transfers functional transfers with min assist  OT Short Term Goal 2 (Week 2): Patient will complete upper body bathing with supervision and setup  OT Short Term Goal 3 (Week 2): Patient will increase A/ROM of L UE to increase functional performance during ADL tasks.  Skilled Therapeutic Interventions/Progress Updates:  -Bioness intiated this date. Medium Left; B Panel Ext; A Panel Flex. D: 6, Ext 7, Flex 7. Open/close exercise mode for 3 mins.  -Patient utilized Bioness to perform colored wooden  pegboard activity. Therapist provided physical assistance to support and move L UE as patient actively flexed fingers to grip pegs and bioness provided extension of the fingers to drop pegs in bucket. Patient performed entire pegboard in 6 minutes.  -L UE A/ROM with towel on table top performed; 10 reps; 1 set; shoulder flex/ext, grip squeeze/release, elbow flexion/extension, forearm supinaton/pronation. Encouraged patient to perform these exercises in his room when not in therapy. Patient verbalized understanding. -MMRT board used. Patient used L UE to flip 10 discs from yellow to orange. Max vc's for technique and motivation. Patient used R UE at times to assist with motor control and movement of L UE, especially when performing finger extension.  Therapy Documentation Precautions:  Precautions Precautions: Fall Precaution Comments: abnormal LUE & LLE tone Restrictions Weight Bearing Restrictions: No General:   Vital Signs: Therapy Vitals Pulse Rate: 74  Resp: 18  BP: 113/73 mmHg Patient Position, if appropriate: Sitting Pain: No pain noted. See FIM for current functional status  Therapy/Group: Individual Therapy  Arya Luttrull, Charisse March 11/01/2011, 11:09 AM

## 2011-11-01 NOTE — Progress Notes (Signed)
Speech Language Pathology Daily Session Note  Patient Details  Name: TAHMIR KLECKNER MRN: [id number] Date of Birth: 1935/06/16  Today's Date: 11/01/2011 Time: 8657-8469 Time Calculation (min): 45 min  Short Term Goals: Week 2: SLP Short Term Goal 1 (Week 2): Pt will demonstrate functional problem solving for basic tasks with supervision cues SLP Short Term Goal 1 - Progress (Week 2): Progressing toward goal SLP Short Term Goal 2 (Week 2): Pt will utilize speech intelligibility strategies at conversational level with mod I verbal cues. SLP Short Term Goal 2 - Progress (Week 2): Progressing toward goal SLP Short Term Goal 3 (Week 2): Pt will tolerate trials of soft mechanical consistencies with mod I. SLP Short Term Goal 3 - Progress (Week 2): Progressing toward goal  Skilled Therapeutic Interventions: Pt. seen this afternoon for cognitive facilitation with concentration on problem solving.  SLP provided both verbal and visual cues during insurance policy activity requiring pt. to scan, locate info, follow commands and make decisions (easiest level 1= min-mild assist, medium level 2 = max assist).  Pt.'s thinking appears concrete with intermittent cues for mental flexibility.  Decreased emergent awareness as pt. rated himself as performing well and "still would have done well if you weren't here".  Wife needs further education of his cognitive assist level.   FIM:  Comprehension Comprehension: 5-Understands basic 90% of the time/requires cueing < 10% of the time Expression Expression Mode: Verbal Expression: 5-Expresses complex 90% of the time/cues < 10% of the time Social Interaction Social Interaction: 5-Interacts appropriately 90% of the time - Needs monitoring or encouragement for participation or interaction. Problem Solving Problem Solving: 4-Solves basic 75 - 89% of the time/requires cueing 10 - 24% of the time Memory Memory: 4-Recognizes or recalls 75 - 89% of the time/requires  cueing 10 - 24% of the time  Pain Pain Assessment Pain Assessment: No/denies pain  Therapy/Group: Individual Therapy  Royce Macadamia 11/01/2011, 3:58 PM

## 2011-11-01 NOTE — Patient Care Conference (Signed)
Inpatient RehabilitationTeam Conference Note Date: 11/01/2011   Time: 10:30    Patient Name: Shawn Wiley      Medical Record Number: [id number]  Date of Birth: Sep 22, 1935 Sex: Male         Room/Bed: 4031/4031-01 Payor Info: Payor: MEDICARE  Plan: MEDICARE PART A AND B  Product Type: *No Product type*     Admitting Diagnosis: CVA/MI  Admit Date/Time:  10/20/2011 12:23 PM Admission Comments: No comment available   Primary Diagnosis:  CVA (cerebral infarction) Principal Problem: CVA (cerebral infarction)  Patient Active Problem List   Diagnosis Date Noted  . CVA (cerebral infarction) 10/20/2011    Expected Discharge Date: Expected Discharge Date: 11/10/11  Team Members Present: Physician: Dr. Levada Dy, RN Case Manager Present: Lutricia Horsfall, RN Social Worker Present: Dossie Der, LCSW PT Present: Edman Circle, PT OT Present: Bretta Bang, Verlene Mayer, OT Verlon Setting, RN Tora Duck, RN, PPS Coord    Current Status/Progress Goal Weekly Team Focus  Medical   urinary continence improving, sleep overall improved  continue current medications  time toileting for urinary incontinence   Bowel/Bladder   Continent of bladder-staff assists with urinal; colostomy intact-care by staff  Continent of bladder; manage bowel with colostomy      Swallow/Nutrition/ Hydration   Upgraded to Dys 3 texture, continue thin.  No s/s aspiration.  Tolerating chopped meats.  Currently supervision-min assist  Supervision  continue education   ADL's   Supervision: Grooming; Min Assist: bathing/UB dressing/LB dressing; Mod A for shower transfers and toileting.  Supervision for eating/grooming; Min A for bathing/dressing/functional transfers.  Increase ADL performed; A/ROM LUE; family education; functional transfers.   Mobility   min assist> supervision bed mobility; mod assist transfer; mod/max assist gait x 52'; supervision/min assist w/c x 157'  min assist overall gait and  wc  mobility, pt and family ed, neuro re-ed, custom AFO   Communication   Mod I  Mod I  goal met   Safety/Cognition/ Behavioral Observations  min assit  Supervision  emergent and anticipatory awareness   Pain   Denies  </=2       Skin   Bruising to BUE and abdomen.  No new skin breakdown.         *See Interdisciplinary Assessment and Plan and progress notes for long and short-term goals  Barriers to Discharge: family teaching    Possible Resolutions to Barriers:  see above    Discharge Planning/Teaching Needs:  Home with wife to provide care, here daily and participating in therapies      Team Discussion:  Pt has poor recall, needs much education. Cognitive deficits. To get custom AFO today. Diet upgrade to D3 today.   Revisions to Treatment Plan:     Continued Need for Acute Rehabilitation Level of Care: The patient requires daily medical management by a physician with specialized training in physical medicine and rehabilitation for the following conditions: Daily direction of a multidisciplinary physical rehabilitation program to ensure safe treatment while eliciting the highest outcome that is of practical value to the patient.: Yes Daily medical management of patient stability for increased activity during participation in an intensive rehabilitation regime.: Yes Daily analysis of laboratory values and/or radiology reports with any subsequent need for medication adjustment of medical intervention for : Neurological problems  Meryl Dare 11/03/2011, 9:45 AM

## 2011-11-01 NOTE — Progress Notes (Signed)
Orthopedic Tech Progress Note Patient Details:  Shawn Wiley 05-17-1935 [id number] Brace order fitted by Margaret Pyle of [employer] at 3:30. Patient ID: Shawn Wiley, male   DOB: 20-Jan-1936, 75 y.o.   MRN: [id number]   [name] 11/01/2011, 3:32 PM

## 2011-11-01 NOTE — Evaluation (Signed)
Recreational Therapy Assessment and Plan  Patient Details  Name: Shawn Wiley MRN: [id number] Date of Birth: October 21, 1935 Today's Date: 11/01/2011  Rehab Potential: Good ELOS: 2 weeks   Assessment Clinical Impression: Problem List:  Patient Active Problem List   Diagnosis   .  CVA (cerebral infarction)    Past Medical History:  Past Medical History   Diagnosis  Date   .  Coronary artery disease    .  Myocardial infarction    .  Stroke    .  GERD (gastroesophageal reflux disease)    .  Hypertension     Past Surgical History:  Past Surgical History   Procedure  Date   .  Hernia repair  2012   .  Cholecystectomy  2012   .  Colon surgery  2008   .  Cardiac catheterization  2013    Assessment & Plan  Clinical Impression:76 year old right-handed male with previous subtotal colectomy, ileostomy and cholecystectomy. Admitted to [facility] 10/09/2011 with abdominal pain. Patient states that on 10/06/2011 (having abdominal discomfort in the right upper quadrant as well as left upper quadrant. He stated that his ileostomy is working regularly. Patient became jaundice as well his mild elevation in liver function studies. Noted bilirubin of 4.1. Patient plan was for ERCP per Dr. Derenda Fennel. During procedure CODE BLUE was called as patient developed acute onset of respiratory distress and hypoxia with bradycardia. Initial heart rates were in the 40s and oxygen saturations in the 70s. EKG showed ST segment elevation myocardial infarction, he was transferred to intensive care unit. Noted by ERCP felt to be obstructive jaundice. He also undergone endoscopic retrograde cholangiopancreatography that was unremarkable. Cardiology was consulted and echocardiogram completed that showed mild concentric left ventricular hypertrophy with ejection fraction 65%. Patient with noted left-sided weakness and slurred speech after ERCP. MRI of the brain June 13 showed restricted diffusion in the right  frontal and parietal cortex compatible with acute or subacute infarct. No hemorrhage or mass noted. CTA of the chest showed no pulmonary embolism. Carotid Doppler showed calcified plaque bilaterally in the carotid bulbs around 50-69% stenosis suspected bilaterally. Patient was placed on aspirin therapy as well as subcutaneous Lovenox for DVT prophylaxis. Pertaining to his obstructive jaundice he had been placed on Zosyn and changed to Ceftin which was felt to be well for any cholecystitis type problems and patient remained afebrile. Therapies were initiated with slow progress. Patient noted left hemiplegia with neglect which limited his overall mobility. He was transferred to [facility] 10/18/2011 for heart catheterization after acute non-ST segment elevation myocardial infarction with cardiac cath unremarkable and advised medical regimen of continuing aspirin but his subcutaneous Lovenox was discontinued at that time and placed on Xarelto for paroxysmal atrial flutter. Patient was admitted to [facility] inpatient rehabilitation services for comprehensive rehabilitation program secondary to deconditioning after CVA/myocardial infarction.  Patient transferred to CIR on 10/20/2011 .   Patient presents with decreased activity tolerance, decreased functional mobility, decreased balance, left sided weakness limiting pt's independence with leisure/community pursuits. Leisure History/Participation Premorbid leisure interest/current participation: Garment/textile technologist - Travel (Comment);Community - Grocery store;Community - Financial controller - Exercise (Comment) (goes to senior gym) Expression Interests: Music (Comment);Play instrument (Comment);Singing (guitar, leads music) Other Leisure Interests: Television Leisure Participation Style: Alone;With Family/Friends Awareness of Community Resources: Good-identify 3 post discharge leisure resources Psychosocial / Spiritual Spiritual Interests:  Church Social interaction - Mood/Behavior: Cooperative Firefighter Appropriate for Education?: Yes Patient Agreeable to [employer]?: Yes Recreational Therapy  Orientation Orientation -Reviewed with patient: Available activity resources Strengths/Weaknesses Patient Strengths/Abilities: Willingness to participate;Active premorbidly Patient weaknesses: Physical limitations  Plan Rec Therapy Plan Is patient appropriate for Therapeutic Recreation?: Yes Rehab Potential: Good Treatment times per week: min1 time per week > 20 minutes Estimated Length of Stay: 2 weeks TR Treatment/Interventions: Adaptive equipment instruction;1:1 session;Balance/vestibular training;Community reintegration;Functional mobility training;Patient/family education;Recreation/leisure participation;Therapeutic activities;Therapeutic exercise;UE/LE Coordination activities  Recommendations for other services: None  Discharge Criteria: Patient will be discharged from TR if patient refuses treatment 3 consecutive times without medical reason.  If treatment goals not met, if there is a change in medical status, if patient makes no progress towards goals or if patient is discharged from hospital.  The above assessment, treatment plan, treatment alternatives and goals were discussed and mutually agreed upon: by patient  Tonea Leiphart 11/01/2011, 12:09 PM

## 2011-11-01 NOTE — Progress Notes (Signed)
Patient ID: Shawn Wiley, male   DOB: 12-13-1935, 76 y.o.   MRN: [id number]  Subjective/Complaints: Slept well Will I play guitar again? Review of Systems  Respiratory: Positive for cough and sputum production. Negative for hemoptysis, shortness of breath and wheezing.   All other systems reviewed and are negative.   Objective: Vital Signs: Blood pressure 106/69, pulse 69, temperature 98.7 F (37.1 C), temperature source Oral, resp. rate 17, height 5\' 10"  (1.778 m), weight 76.9 kg (169 lb 8.5 oz), SpO2 95.00%. No results found. No results found for this or any previous visit (from the past 72 hour(s)).   HEENT: normal Cardio: RRR Resp: CTA B/L GI: BS positive and RLQ colostomy Extremity:  No Edema Skin:   Intact Neuro: Alert/Oriented, Abnormal Sensory tingling with Lt touch LUE and LLE and Abnormal Motor 3-/5 with flexor synergy LUE, 2-/5 with ext synergy LLE except 0/5 L ankle Musc/Skel:  Normal Tone ashworth 3 at triceps, 2 in biceps and finger/wrist flexors Unable to oppose finger to thumb Assessment/Plan: 1. Functional deficits secondary to R frontal and pariental infarct as well as decondtioning which require 3+ hours per day of interdisciplinary therapy in a comprehensive inpatient rehab setting. Physiatrist is providing close team supervision and 24 hour management of active medical problems listed below. Physiatrist and rehab team continue to assess barriers to discharge/monitor patient progress toward functional and medical goals. FIM: FIM - Bathing Bathing Steps Patient Completed: Chest;Right Arm;Left Arm;Abdomen;Front perineal area;Buttocks;Right lower leg (including foot);Left upper leg;Right upper leg;Left lower leg (including foot) Bathing: 4: Min-Patient completes 8-9 42f 10 parts or 75+ percent  FIM - Upper Body Dressing/Undressing Upper body dressing/undressing steps patient completed: Thread/unthread right sleeve of pullover shirt/dresss;Thread/unthread left  sleeve of pullover shirt/dress;Put head through opening of pull over shirt/dress;Pull shirt over trunk Upper body dressing/undressing: 4: Min-Patient completed 75 plus % of tasks FIM - Lower Body Dressing/Undressing Lower body dressing/undressing steps patient completed: Thread/unthread right underwear leg;Thread/unthread left underwear leg;Pull underwear up/down;Thread/unthread right pants leg;Thread/unthread left pants leg;Pull pants up/down;Don/Doff right shoe;Don/Doff left shoe;Fasten/unfasten right shoe;Fasten/unfasten left shoe Lower body dressing/undressing: 4: Min-Patient completed 75 plus % of tasks  FIM - Toileting Toileting steps completed by patient: Performs perineal hygiene;Adjust clothing after toileting Toileting Assistive Devices:  (urinal is not an assistive device for toileting) Toileting: 0: Activity did not occur  FIM - Archivist Transfers Assistive Devices: Bedside commode;Grab bars Toilet Transfers: 3-To toilet/BSC: Mod A (lift or lower assist);3-From toilet/BSC: Mod A (lift or lower assist)  FIM - Bed/Chair Transfer Bed/Chair Transfer Assistive Devices: Arm rests Bed/Chair Transfer: 4: Sit > Supine: Min A (steadying pt. > 75%/lift 1 leg);4: Supine > Sit: Min A (steadying Pt. > 75%/lift 1 leg)  FIM - Locomotion: Wheelchair Locomotion: Wheelchair: 1: Travels less than 50 ft with supervision, cueing or coaxing FIM - Locomotion: Ambulation Locomotion: Ambulation Assistive Devices: Designer, industrial/product Ambulation/Gait Assistance: 3: Mod assist Locomotion: Ambulation: 2: Travels 50 - 149 ft with moderate assistance (Pt: 50 - 74%)  Comprehension Comprehension Mode: Auditory Comprehension: 5-Understands complex 90% of the time/Cues < 10% of the time  Expression Expression Mode: Verbal Expression: 5-Expresses complex 90% of the time/cues < 10% of the time  Social Interaction Social Interaction: 5-Interacts appropriately 90% of the time - Needs monitoring or  encouragement for participation or interaction.  Problem Solving Problem Solving: 5-Solves basic 90% of the time/requires cueing < 10% of the time  Memory Memory: 4-Recognizes or recalls 75 - 89% of the time/requires cueing 10 -  24% of the time  Medical Problem List and Plan:  1. Right frontal parietal CVA after ERCP , spasticity an issue will add zanaflex 2. DVT Prophylaxis/Anticoagulation: SCDs. Monitor for any signs of DVT.  3. Acute non-ST segment elevation myocardial infarction. Cardiac cath unremarkable. No complaints of chest pain or shortness of breath. Continue medical management with aspirin and Xarelto, trying to get pt assistance  4. Obstructive jaundice/history ileostomy. Monitor LFTs and routine care of ileostomy. Continue Protonix  -pt,wife had been independent with ostomy care PTA  -serial labs  5. Hypertension. Lisinopril 10 mg daily and Lopressor 25 mg twice a day. Monitor with increased mobility hild lisinopril today for BP 100 systolic 6. PAF. Xarelto 20 mg daily. Cardiac rate controlled , monitor renal fx 6. Hyperlipidemia. Crestor  7. Dysphagia. Currently on dysphagia 3 nectar liquids. We'll have speech therapy followup  8. Tone: ROM, splinting, tizanidine improved clonus but developing L ankle contracture needs PRAFO 9.  Leukocytosis likely secondary to common bile duct obstruction , afeb, monitor WBC completed ceftin 10. Insomnia improved on elavil  LOS (Days) 12 A FACE TO FACE EVALUATION WAS PERFORMED  KIRSTEINS,ANDREW E 11/01/2011, 7:38 AM

## 2011-11-01 NOTE — Progress Notes (Signed)
Occupational Therapy Note  Patient Details  Name: Shawn Wiley MRN: [id number] Date of Birth: 10-Jul-1935 Today's Date: 11/01/2011  Time: 1400-1430 Pt denies pain Individual Therapy  Pt's wife present to observe therapy.  Pt engaged in neuro reed activities including LUE weight bearing activities through elbow and shoulder at 45 and 90 degree shoulder flexion.  Pt transitioned to shoulder flexion/extension activities with emphasis on muscle group isolation with limited recruitment of ancillary muscle groups.  Pt practiced finger flexion/extension with AAROM.  Pt able to flex digits 3,4,and 5 with limited flexion in digit 1.  Pt does note exhibit active movement in left thumb.     Lavone Neri [facility] 11/01/2011, 2:57 PM

## 2011-11-02 NOTE — Progress Notes (Signed)
Occupational Therapy Session Note  Patient Details  Name: Shawn Wiley MRN: [id number] Date of Birth: Sep 21, 1935  Today's Date: 11/02/2011 Time: 7829-5621 Time Calculation (min): 31 min   Skilled Therapeutic Interventions/Progress Updates:    Micah Flesher to therapy gym to focus on LUE strengthening.  Started in sitting using the LUE in closed chain weight bearing to help with sit to squat and for scooting up the edge of the mat.  Pt needs min assist to perform scooting and maintain LUE in weight bearing.  Also had him position the LUE on therapist's shoulder and focus on pushing the therapist away from him with elbow extension and shoulder horizontal abduction.  Pt needs max instructional cueing and visual attention to maintain active movement and only able to do so for approximately 30 seconds before fatiguing and needing rest.  Pt able to demonstrate gross grasp but only minimal digit extension.  Encouraged pt to focus on finger extension in his room instead of finger flexion secondary to activation imbalance.  Therapy Documentation Precautions:  Precautions Precautions: Fall Precaution Comments: abnormal LUE & LLE tone Restrictions Weight Bearing Restrictions: No  Pain: Pain Assessment Pain Assessment: No/denies pain Pain Score: 0-No pain ADL: See FIM for current functional status  Therapy/Group: Individual Therapy  Tilak Oakley OTR/L 11/02/2011, 3:51 PM

## 2011-11-02 NOTE — Progress Notes (Addendum)
Physical Therapy Note  Patient Details  Name: Shawn Wiley MRN: [id number] Date of Birth: 14-Oct-1935 Today's Date: 11/02/2011  9:30-10:30 individual therapy pt denied pain.  Initial gait with AFO and rw with hand splint 100' with mod to max assist with max vc for keeping head up so his left foot would not catch floor. Performed Nustep level 6 x 13 minutes with left hip blocked into neutral to loosen tone in hip and for strengthening. Performed curb steps with rw forward mod assist with vc for technique and assist to place walker on and off of step.   Julian Reil 11/02/2011, 11:57 AM  12 steps with rt rail min assist step to pattern with assist x 1 for left foot placement with descent.

## 2011-11-02 NOTE — Progress Notes (Signed)
Occupational Therapy Session Note  Patient Details  Name: Shawn Wiley MRN: [id number] Date of Birth: 10-Sep-1935  Today's Date: 11/02/2011 Time: 8295-6213 (1030-1100) Time Calculation (min): 60 min ( )  Short Term Goals: Week 1:  OT Short Term Goal 1 (Week 1): Patient will complete transfers functional transfers with min assist  OT Short Term Goal 1 - Progress (Week 1): Progressing toward goal OT Short Term Goal 2 (Week 1): Patient will complete upper body bathing with supervision and setup  OT Short Term Goal 2 - Progress (Week 1): Progressing toward goal OT Short Term Goal 3 (Week 1): Patient will complete lower body bathing with min assist  OT Short Term Goal 3 - Progress (Week 1): Met OT Short Term Goal 4 (Week 1): Patient will complete upper body dressing with min assist  OT Short Term Goal 4 - Progress (Week 1): Met OT Short Term Goal 5 (Week 1): Patient will complete lower body dressing with moderate assist OT Short Term Goal 5 - Progress (Week 1): Met Week 2:  OT Short Term Goal 1 (Week 2): Patient will complete transfers functional transfers with min assist  OT Short Term Goal 2 (Week 2): Patient will complete upper body bathing with supervision and setup  OT Short Term Goal 3 (Week 2): Patient will increase A/ROM of L UE to increase functional performance during ADL tasks.  Skilled Therapeutic Interventions/Progress Updates:  Session 1: ADL re-training performed in shower this AM. Patient showing improvement with functional transfers to toilet and shower. Patient's dynamic sitting balance Fair during shower. Patient dressed at sink. Education provided to donn AFO on LLE. Patient having difficulty scooping left leg up and crossing over right leg. Long handled shoe horn and long handled sponge ordered for patient to increase functional performance during bathing and dressing. vc's provided for hemi-dressing technqiues. -Patient performed yellow theraputty HEP with max vc's and  min visual cues to complete from therapist. Patient required physical assistance to extend fingers during exercises.  Session 2: Bioness used this date. Medium Left; B Panel Ext; A Panel Flex. D: 6, Ext 8, Flex 7. -Patient utilized Bioness to perform colored wooden pegboard activity. Therapist provided physical assistance to support and move L UE as patient actively flexed fingers to grip pegs and bioness provided extension of the fingers to drop pegs in bucket. Patient performed entire pegboard in 7 minutes.  -ROM Arc performed at level 1 while seated with LUE. vc's to work on controlling gross motor movement of left arm by performing shoulder flexion to reach for rings; without compensating with extra UB movement; pinching rings with pointer and thumb and then performing horizontal adduction while moving the rings from the left side to right side. Therapist provided min physical assist to LUE when reaching for rings. 1 rest break required during task.    Therapy Documentation Precautions:  Precautions Precautions: Fall Precaution Comments: abnormal LUE & LLE tone Restrictions Weight Bearing Restrictions: No Pain: Pain Assessment Pain Assessment: No/denies pain  See FIM for current functional status  Therapy/Group: Individual Therapy  Cathern Tahir, Charisse March 11/02/2011, 11:12 AM

## 2011-11-02 NOTE — Progress Notes (Signed)
Patient ID: Shawn Wiley, male   DOB: [birth year], 76 y.o.   MRN: [id number]  Subjective/Complaints: Slept well Will I play guitar again? Review of Systems  Respiratory: Positive for cough and sputum production. Negative for hemoptysis, shortness of breath and wheezing.   All other systems reviewed and are negative.   Objective: Vital Signs: Blood pressure 113/74, pulse 68, temperature 98.7 F (37.1 C), temperature source Oral, resp. rate 17, height 5\' 10"  (1.778 m), weight 75.932 kg (167 lb 6.4 oz), SpO2 95.00%. No results found. No results found for this or any previous visit (from the past 72 hour(s)).   HEENT: normal Cardio: RRR Resp: CTA B/L GI: BS positive and RLQ colostomy Extremity:  No Edema Skin:   Intact Neuro: Alert/Oriented, Abnormal Sensory tingling with Lt touch LUE and LLE and Abnormal Motor 3-/5 with flexor synergy LUE, 2-/5 with ext synergy LLE except 0/5 L ankle Musc/Skel:  Normal Tone ashworth 3 at triceps, 2 in biceps and finger/wrist flexors Unable to oppose finger to thumb New 3-/5 L Wrist extensor strength Assessment/Plan: 1. Functional deficits secondary to R frontal and pariental infarct as well as decondtioning which require 3+ hours per day of interdisciplinary therapy in a comprehensive inpatient rehab setting. Physiatrist is providing close team supervision and 24 hour management of active medical problems listed below. Physiatrist and rehab team continue to assess barriers to discharge/monitor patient progress toward functional and medical goals. FIM: FIM - Bathing Bathing Steps Patient Completed: Chest;Right Arm;Left Arm;Abdomen;Front perineal area;Buttocks;Right lower leg (including foot);Left upper leg;Right upper leg;Left lower leg (including foot) Bathing: 4: Min-Patient completes 8-9 55f 10 parts or 75+ percent  FIM - Upper Body Dressing/Undressing Upper body dressing/undressing steps patient completed: Thread/unthread right sleeve of pullover  shirt/dresss;Thread/unthread left sleeve of pullover shirt/dress;Put head through opening of pull over shirt/dress;Pull shirt over trunk Upper body dressing/undressing: 4: Min-Patient completed 75 plus % of tasks FIM - Lower Body Dressing/Undressing Lower body dressing/undressing steps patient completed: Thread/unthread right underwear leg;Thread/unthread left underwear leg;Pull underwear up/down;Thread/unthread right pants leg;Thread/unthread left pants leg;Pull pants up/down;Don/Doff right shoe;Don/Doff left shoe;Fasten/unfasten right shoe;Fasten/unfasten left shoe Lower body dressing/undressing: 4: Min-Patient completed 75 plus % of tasks  FIM - Toileting Toileting steps completed by patient: Performs perineal hygiene Toileting Assistive Devices:  (urinal is not an assistive device for toileting) Toileting: 6: More than reasonable amount of time  FIM - Diplomatic Services operational officer Devices: Bedside commode;Grab bars Toilet Transfers: 3-To toilet/BSC: Mod A (lift or lower assist);3-From toilet/BSC: Mod A (lift or lower assist)  FIM - Bed/Chair Transfer Bed/Chair Transfer Assistive Devices: Arm rests Bed/Chair Transfer: 4: Sit > Supine: Min A (steadying pt. > 75%/lift 1 leg);4: Supine > Sit: Min A (steadying Pt. > 75%/lift 1 leg)  FIM - Locomotion: Wheelchair Locomotion: Wheelchair: 1: Travels less than 50 ft with supervision, cueing or coaxing FIM - Locomotion: Ambulation Locomotion: Ambulation Assistive Devices: Designer, industrial/product Ambulation/Gait Assistance: 3: Mod assist Locomotion: Ambulation: 2: Travels 50 - 149 ft with moderate assistance (Pt: 50 - 74%)  Comprehension Comprehension Mode: Auditory Comprehension: 5-Understands complex 90% of the time/Cues < 10% of the time  Expression Expression Mode: Verbal Expression: 5-Expresses complex 90% of the time/cues < 10% of the time  Social Interaction Social Interaction: 5-Interacts appropriately 90% of the time -  Needs monitoring or encouragement for participation or interaction.  Problem Solving Problem Solving: 4-Solves basic 75 - 89% of the time/requires cueing 10 - 24% of the time  Memory Memory: 4-Recognizes or recalls  75 - 89% of the time/requires cueing 10 - 24% of the time  Medical Problem List and Plan:  1. Right frontal parietal CVA after ERCP , spasticity an issue will add zanaflex 2. DVT Prophylaxis/Anticoagulation: SCDs. Monitor for any signs of DVT.  3. Acute non-ST segment elevation myocardial infarction. Cardiac cath unremarkable. No complaints of chest pain or shortness of breath. Continue medical management with aspirin and Xarelto, trying to get pt assistance  4. Obstructive jaundice/history ileostomy. Monitor LFTs and routine care of ileostomy. Continue Protonix  -pt,wife had been independent with ostomy care PTA  -serial labs  5. Hypertension. Lisinopril 10 mg daily and Lopressor 25 mg twice a day. Monitor with increased mobility hild lisinopril today for BP 100 systolic 6. PAF. Xarelto 20 mg daily. Cardiac rate controlled , monitor renal fx 6. Hyperlipidemia. Crestor  7. Dysphagia. Currently on dysphagia 3 nectar liquids. We'll have speech therapy followup  8. Tone: ROM, splinting, tizanidine improved clonus but developing L ankle contracture needs PRAFO 9.  Leukocytosis likely secondary to common bile duct obstruction , afeb, monitor WBC completed ceftin 10. Insomnia improved on elavil  LOS (Days) 13 A FACE TO FACE EVALUATION WAS PERFORMED  KIRSTEINS,ANDREW E 11/02/2011, 7:48 AM

## 2011-11-02 NOTE — Progress Notes (Signed)
Speech Language Pathology Daily Session Note  Patient Details  Name: Shawn Wiley MRN: [id number] Date of Birth: November 30, 1935  Today's Date: 11/02/2011 Time: 1130 (Simultaneous filing. User may not have seen previous data.)-1200 (Simultaneous filing. User may not have seen previous data.) Time Calculation (min): 30 min (Simultaneous filing. User may not have seen previous data.)  Short Term Goals: Week 2: SLP Short Term Goal 1 (Week 2): Pt will demonstrate functional problem solving for basic tasks with supervision cues SLP Short Term Goal 1 - Progress (Week 2): Progressing toward goal SLP Short Term Goal 2 (Week 2): Pt will utilize speech intelligibility strategies at conversational level with mod I verbal cues. SLP Short Term Goal 2 - Progress (Week 2): Progressing toward goal SLP Short Term Goal 3 (Week 2): Pt will tolerate trials of soft mechanical consistencies with mod I. SLP Short Term Goal 3 - Progress (Week 2): Progressing toward goal  Skilled Therapeutic Interventions: Patient seen this a.m. to address expressive speech and cognitive-lingusitic goals. Patient's wife present during tx session. Patient recalled and verbalized swallow precautions without cues, and stated that they do help "to not get strangled." Overall intelligibilty at conversational level judged by this clinician to be  in range of 85-90%. Patient's spouse stated that she  felt his speech was 99.4 % of his baseline, and patient stated it was 98%. Patient required minimal context and question cues to recall recent events, however he acknowledged that his memory was "not as good". SLP directed patient to complete novel task using iPad; patient asked for assistance appropriately, and required minimal verbal cues to direct/redirect attention.   FIM:  Comprehension Comprehension Mode: Auditory Comprehension: 5-Understands complex 90% of the time/Cues < 10% of the time Expression Expression Mode: Verbal Expression:  5-Expresses complex 90% of the time/cues < 10% of the time Social Interaction Social Interaction: 6-Interacts appropriately with others with medication or extra time (anti-anxiety, antidepressant). Problem Solving Problem Solving: 5-Solves basic 90% of the time/requires cueing < 10% of the time Memory Memory: 4-Recognizes or recalls 75 - 89% of the time/requires cueing 10 - 24% of the time  Pain Pain Assessment Pain Assessment: No/denies pain Pain Score: 0-No pain  Therapy/Group: Individual Therapy  Pablo Lawrence 11/02/2011, 12:12 PM  Angela Nevin, MA, CCC-SLP [facility] Speech-Language Pathologist

## 2011-11-03 DIAGNOSIS — I6330 Cerebral infarction due to thrombosis of unspecified cerebral artery: Secondary | ICD-10-CM

## 2011-11-03 DIAGNOSIS — Z5189 Encounter for other specified aftercare: Secondary | ICD-10-CM

## 2011-11-03 DIAGNOSIS — G8110 Spastic hemiplegia affecting unspecified side: Secondary | ICD-10-CM

## 2011-11-03 NOTE — Progress Notes (Signed)
Physical Therapy Note  Patient Details  Name: JON LALL MRN: [id number] Date of Birth: 1936/03/14 Today's Date: 11/03/2011  14:15-15:15 individual therapy pt denied pain.  Pt performed bed mobility to left with supervision and squat transfer to rt with supervision squat pivot, wc mobility supervision hemi technique 150', practiced side stepping to left to using hand rail with initial vc and tactile cues for sequence to align body for improved movement and safety. Pt progressed to min cues for 4 repetitions, also practiced stepping backwards with left leg without leaning trunk forward with min assist and min vc for trunk position. Pt demonstrated carryover with stand pivot transfers and was able to step to rt ans backwards with improved step length and less compensation making transfer safer. Gait x 40' with min to mod assist with vc for sequence and step length and trunk position.  Julian Reil 11/03/2011, 4:29 PM

## 2011-11-03 NOTE — Progress Notes (Signed)
Occupational Therapy Session Note  Patient Details  Name: Shawn Wiley MRN: [id number] Date of Birth: Feb 21, 1936  Today's Date: 11/03/2011 Time: 4098-1191 (1030-1100) Time Calculation (min): 60 min ( )  Short Term Goals: Week 1:  OT Short Term Goal 1 (Week 1): Patient will complete transfers functional transfers with min assist  OT Short Term Goal 1 - Progress (Week 1): Progressing toward goal OT Short Term Goal 2 (Week 1): Patient will complete upper body bathing with supervision and setup  OT Short Term Goal 2 - Progress (Week 1): Progressing toward goal OT Short Term Goal 3 (Week 1): Patient will complete lower body bathing with min assist  OT Short Term Goal 3 - Progress (Week 1): Met OT Short Term Goal 4 (Week 1): Patient will complete upper body dressing with min assist  OT Short Term Goal 4 - Progress (Week 1): Met OT Short Term Goal 5 (Week 1): Patient will complete lower body dressing with moderate assist OT Short Term Goal 5 - Progress (Week 1): Met Week 2:  OT Short Term Goal 1 (Week 2): Patient will complete transfers functional transfers with min assist  OT Short Term Goal 2 (Week 2): Patient will complete upper body bathing with supervision and setup  OT Short Term Goal 3 (Week 2): Patient will increase A/ROM of L UE to increase functional performance during ADL tasks.  Skilled Therapeutic Interventions/Progress Updates:  Session 1: ADL re-training performed in shower this date. Patient unable to reach feet when sitting in shower without losing balance. No vc's required for LB hemi-dressing technique. Patient did need vc's for UB hemi-dressing technique. Dynamic sitting balance was Fair during dressing tasks. Wife brought new shoes for patient to try for left AFO. Discovered that AFO should be placed in shoe first before putting foot in shoe.   Session 2:  -Functional transfer from bed to w/c: Contact guard assist for safety and min vc's for technique with arm rest  raised for a low squat pivot/scoot transfer towards right side.  -Patient utilized colored wooden pegboard on tilted angle on table top. Patient required to raise LUE and grab peg from board and drop into basket. Patient worked on controlling shoulder flexion, horizontal shoulder abduction and grasp and release patterns in left hand. Max vc's for technique ie. "open" and "close" for grasp and release. Patient took a rest break every 2 pegs due to fatigue.  -AAROM exercise performed on small globe ball on table; LUE; shoulder flexion/extension; 10 reps; 1 set. -A/ROM; LUE; on towel on table top; shoulder flexion/extension, wrist flexion/extension, supinaton/pronation, grasp and release; 5 reps; 1 set. Physical assistance provided to LUE to prevent substitution patterns and to aid in targeting specific muscle movement patterns.  Therapy Documentation Precautions:  Precautions Precautions: Fall Precaution Comments: abnormal LUE & LLE tone Restrictions Weight Bearing Restrictions: No Pain: Pain Assessment Pain Assessment: No/denies pain  See FIM for current functional status  Therapy/Group: Individual Therapy  Montie Swiderski, Charisse March 11/03/2011, 11:32 AM

## 2011-11-03 NOTE — Progress Notes (Signed)
Physical Therapy Weekly Progress Note  Patient Details  Name: HAMDI KLEY MRN: [id number] Date of Birth: 1935-12-18  Today's Date: 11/03/2011 Time: 1100-1145 Time Calculation (min): 45 min  Patient has met 4 of 4 short term goals.    Patient continues to demonstrate the following deficits: weakness, balance, safety/impulsivity, awareness and therefore will continue to benefit from skilled PT intervention to enhance overall performance with balance, postural control, ability to compensate for deficits, functional use of  left upper extremity and left lower extremity, attention and awareness.  Patient progressing toward long term goals..  Continue plan of care.  Pt received custom L AFO 11/01/11.  PT Short Term Goals Week 2:  PT Short Term Goal 1 (Week 2): Pt will perform bed mobility with min assist. PT Short Term Goal 1 - Progress (Week 2): Met PT Short Term Goal 2 (Week 2): pt will perform transfer to R with supervision. PT Short Term Goal 2 - Progress (Week 2): Met PT Short Term Goal 3 (Week 2): Pt will propel w/c x 150' with supervision. PT Short Term Goal 3 - Progress (Week 2): Met PT Short Term Goal 4 (Week 2): Pt will perform gait x 35' with min/mod assist consistently, with LRAD.   PT Short Term Goal 4 - Progress (Week 2): Met  Week 3- LTGs due to LOS  Skilled Therapeutic Interventions/Progress Updates: focus on neuromuscular re-education, safety, functional mobility, custom LLE AFO     Therapy Documentation Precautions:  Precautions Precautions: Fall Precaution Comments: abnormal LUE & LLE tone Restrictions Weight Bearing Restrictions: No          See FIM for current functional status  Therapy/Group: Individual Therapy  Leler Brion 11/03/2011, 3:14 PM

## 2011-11-03 NOTE — Progress Notes (Signed)
Speech Language Pathology Weekly Progress Note  Patient Details  Name: Shawn Wiley MRN: [id number] Date of Birth: 1936/02/08  Today's Date: 11/03/2011 Time: 1100-1145 Time Calculation (min): 45 min  Short Term Goals: Week 2: SLP Short Term Goal 1 (Week 2): Pt will demonstrate functional problem solving for basic tasks with supervision cues SLP Short Term Goal 1 - Progress (Week 2): Progressing toward goal SLP Short Term Goal 2 (Week 2): Pt will utilize speech intelligibility strategies at conversational level with mod I verbal cues. SLP Short Term Goal 2 - Progress (Week 2): Met SLP Short Term Goal 3 (Week 2): Pt will tolerate trials of soft mechanical consistencies with mod I. SLP Short Term Goal 3 - Progress (Week 2): Met  Weekly Progress Updates: Pt continues to make excellent progress toward goals.  Speech intelligibility has improved to >95 % at conversational levels.  Dysarthria is minimal with improved articulatory accuracy, particularly with blends and final consonant production. Pt's problem-solving abilities also show improvements - in today's session, pt able to  recall and plan to find his way to two specific locations in hospital; self-cued by searching environment for instructions.  Needed one orientation cue during entire task.  Swallowing is also improved with pt independently verbalizing precautions.  Focus final week should be on overall PO toleration with independence.  New short-term goals week 3:   Pt will tolerate mechanical consistencies independent with precautions. Pt will demonstrate functional problem solving for basic tasks with supervision cues.   SLP Frequency: 1-2 X/day, 30-60 minutes SLP Treatment/Interventions: Cognitive remediation/compensation;Dysphagia/aspiration precaution training  Daily Session FIM:  Comprehension Comprehension Mode: Auditory Comprehension: 5-Understands complex 90% of the time/Cues < 10% of the time Expression Expression  Mode: Verbal Expression: 5-Expresses complex 90% of the time/cues < 10% of the time Social Interaction Social Interaction: 6-Interacts appropriately with others with medication or extra time (anti-anxiety, antidepressant). Problem Solving Problem Solving: 5-Solves complex 90% of the time/cues < 10% of the time Memory Memory: 4-Recognizes or recalls 75 - 89% of the time/requires cueing 10 - 24% of the time General    Pain Pain Assessment Pain Assessment: No/denies pain  Therapy/Group: Individual Therapy  Blenda Mounts Laurice 11/03/2011, 12:37 PM

## 2011-11-03 NOTE — Progress Notes (Signed)
Patient ID: Shawn Wiley, male   DOB: 1935-08-29, 76 y.o.   MRN: [id number]  Subjective/Complaints: See how I can move my hand and arm? Review of Systems  Respiratory: Positive for cough and sputum production. Negative for hemoptysis, shortness of breath and wheezing.   All other systems reviewed and are negative.   Objective: Vital Signs: Blood pressure 106/70, pulse 65, temperature 97.7 F (36.5 C), temperature source Oral, resp. rate 16, height 5\' 10"  (1.778 m), weight 75.932 kg (167 lb 6.4 oz), SpO2 97.00%. No results found. No results found for this or any previous visit (from the past 72 hour(s)).   HEENT: normal Cardio: RRR Resp: CTA B/L GI: BS positive and RLQ colostomy Extremity:  No Edema Skin:   Intact Neuro: Alert/Oriented, Abnormal Sensory tingling with Lt touch LUE and LLE and Abnormal Motor 3-/5 with flexor synergy LUE, 2-/5 with ext synergy LLE except 0/5 L ankle Musc/Skel:  Normal Tone ashworth 3 at triceps, 2 in biceps and finger/wrist flexors Unable to oppose finger to thumb New 3-/5 L Wrist extensor strength Assessment/Plan: 1. Functional deficits secondary to R frontal and pariental infarct as well as decondtioning which require 3+ hours per day of interdisciplinary therapy in a comprehensive inpatient rehab setting. Physiatrist is providing close team supervision and 24 hour management of active medical problems listed below. Physiatrist and rehab team continue to assess barriers to discharge/monitor patient progress toward functional and medical goals. FIM: FIM - Bathing Bathing Steps Patient Completed: Chest;Right Arm;Left Arm;Abdomen;Front perineal area;Buttocks;Right upper leg;Left upper leg;Right lower leg (including foot);Left lower leg (including foot) Bathing: 4: Min-Patient completes 8-9 57f 10 parts or 75+ percent  FIM - Upper Body Dressing/Undressing Upper body dressing/undressing steps patient completed: Thread/unthread right sleeve of pullover  shirt/dresss;Thread/unthread left sleeve of pullover shirt/dress;Put head through opening of pull over shirt/dress;Pull shirt over trunk Upper body dressing/undressing: 4: Min-Patient completed 75 plus % of tasks FIM - Lower Body Dressing/Undressing Lower body dressing/undressing steps patient completed: Thread/unthread right underwear leg;Thread/unthread left underwear leg;Pull underwear up/down;Thread/unthread right pants leg;Thread/unthread left pants leg;Pull pants up/down;Don/Doff right shoe;Don/Doff left shoe;Fasten/unfasten right shoe;Fasten/unfasten left shoe Lower body dressing/undressing: 4: Min-Patient completed 75 plus % of tasks  FIM - Toileting Toileting steps completed by patient: Performs perineal hygiene Toileting Assistive Devices:  (urinal is not an assistive device for toileting) Toileting: 2: Max-Patient completed 1 of 3 steps  FIM - Diplomatic Services operational officer Devices: Grab bars Toilet Transfers: 4-To toilet/BSC: Min A (steadying Pt. > 75%);4-From toilet/BSC: Min A (steadying Pt. > 75%)  FIM - Bed/Chair Transfer Bed/Chair Transfer Assistive Devices: Arm rests Bed/Chair Transfer: 4: Supine > Sit: Min A (steadying Pt. > 75%/lift 1 leg);4: Sit > Supine: Min A (steadying pt. > 75%/lift 1 leg);3: Bed > Chair or W/C: Mod A (lift or lower assist);3: Chair or W/C > Bed: Mod A (lift or lower assist)  FIM - Locomotion: Wheelchair Locomotion: Wheelchair: 0: Activity did not occur FIM - Locomotion: Ambulation Locomotion: Ambulation Assistive Devices: Walker - Rolling;Orthosis (hand splint) Ambulation/Gait Assistance: 3: Mod assist Locomotion: Ambulation: 2: Travels 50 - 149 ft with moderate assistance (Pt: 50 - 74%)  Comprehension Comprehension Mode: Auditory Comprehension: 5-Understands complex 90% of the time/Cues < 10% of the time  Expression Expression Mode: Verbal Expression: 5-Expresses complex 90% of the time/cues < 10% of the time  Social  Interaction Social Interaction: 6-Interacts appropriately with others with medication or extra time (anti-anxiety, antidepressant).  Problem Solving Problem Solving: 5-Solves basic 90% of the time/requires  cueing < 10% of the time  Memory Memory: 4-Recognizes or recalls 75 - 89% of the time/requires cueing 10 - 24% of the time  Medical Problem List and Plan:  1. Right frontal parietal CVA after ERCP , spasticity an issue will add zanaflex 2. DVT Prophylaxis/Anticoagulation: SCDs. Monitor for any signs of DVT.  3. Acute non-ST segment elevation myocardial infarction. Cardiac cath unremarkable. No complaints of chest pain or shortness of breath. Continue medical management with aspirin and Xarelto, trying to get pt assistance  4. Obstructive jaundice/history ileostomy. Monitor LFTs and routine care of ileostomy. Continue Protonix  -pt,wife had been independent with ostomy care PTA  -serial labs  5. Hypertension. Lisinopril 10 mg daily and Lopressor 25 mg twice a day. Monitor with increased mobility hild lisinopril today for BP 100 systolic 6. PAF. Xarelto 20 mg daily. Cardiac rate controlled , monitor renal fx 6. Hyperlipidemia. Crestor  7. Dysphagia. Currently on dysphagia 3 nectar liquids. We'll have speech therapy followup  8. Tone: ROM, splinting, tizanidine improved clonus but developing L ankle contracture needs PRAFO 9.  Leukocytosis likely secondary to common bile duct obstruction , afeb, monitor WBC completed ceftin 10. Insomnia improved on elavil  LOS (Days) 14 A FACE TO FACE EVALUATION WAS PERFORMED  Maury Bamba E 11/03/2011, 7:46 AM

## 2011-11-03 NOTE — Progress Notes (Signed)
Per State Regulation 482.30 This chart was reviewed for medical necessity with respect to the patient's Admission/Duration of stay. Pt participating with very slow progress. Meryl Dare                 Nurse Care Manager            Next Review Date: 11/06/11

## 2011-11-04 NOTE — Progress Notes (Signed)
Subjective/Complaints: No complaints this am Review of Systems  Respiratory: Positive for cough and sputum production. Negative for hemoptysis, shortness of breath and wheezing.   All other systems reviewed and are negative.   Objective: Vital Signs: Blood pressure 103/66, pulse 70, temperature 98.2 F (36.8 C), temperature source Oral, resp. rate 17, height 5\' 10"  (1.778 m), weight 168 lb 10.4 oz (76.5 kg), SpO2 98.00%. No results found. No results found for this or any previous visit (from the past 72 hour(s)).   HEENT: normal Cardio: RRR Resp: CTA B/L GI: BS positive and RLQ colostomy Extremity:  No Edema Skin:   Intact Neuro: Alert/Oriented, Abnormal Sensory tingling with Lt touch LUE and LLE and Abnormal Motor 3-/5 with flexor synergy LUE, 2-/5 with ext synergy LLE except 0/5 L ankle Musc/Skel:  Normal Tone ashworth 3 at triceps, 2 in biceps and finger/wrist flexors Unable to oppose finger to thumb New 3-/5 L Wrist extensor strength Assessment/Plan: 1. Functional deficits secondary to R frontal and pariental infarct as well as decondtioning which require 3+ hours per day of interdisciplinary therapy in a comprehensive inpatient rehab setting. Physiatrist is providing close team supervision and 24 hour management of active medical problems listed below. Physiatrist and rehab team continue to assess barriers to discharge/monitor patient progress toward functional and medical goals. FIM: FIM - Bathing Bathing Steps Patient Completed: Chest;Right Arm;Left Arm;Abdomen;Front perineal area;Buttocks;Right upper leg;Left upper leg;Right lower leg (including foot);Left lower leg (including foot) Bathing: 3: Mod-Patient completes 5-7 61f 10 parts or 50-74%  FIM - Upper Body Dressing/Undressing Upper body dressing/undressing steps patient completed: Thread/unthread right sleeve of pullover shirt/dresss;Thread/unthread left sleeve of pullover shirt/dress;Put head through opening of pull  over shirt/dress;Pull shirt over trunk Upper body dressing/undressing: 5: Set-up assist to: Obtain clothing/put away FIM - Lower Body Dressing/Undressing Lower body dressing/undressing steps patient completed: Thread/unthread right underwear leg;Thread/unthread left underwear leg;Pull underwear up/down;Thread/unthread right pants leg;Thread/unthread left pants leg;Pull pants up/down;Don/Doff right shoe;Don/Doff left shoe;Fasten/unfasten right shoe;Fasten/unfasten left shoe Lower body dressing/undressing: 4: Min-Patient completed 75 plus % of tasks  FIM - Toileting Toileting steps completed by patient: Performs perineal hygiene Toileting Assistive Devices:  (urinal is not an assistive device for toileting) Toileting: 2: Max-Patient completed 1 of 3 steps  FIM - Diplomatic Services operational officer Devices: Grab bars Toilet Transfers: 4-To toilet/BSC: Min A (steadying Pt. > 75%);4-From toilet/BSC: Min A (steadying Pt. > 75%)  FIM - Bed/Chair Transfer Bed/Chair Transfer Assistive Devices: Arm rests Bed/Chair Transfer: 4: Chair or W/C > Bed: Min A (steadying Pt. > 75%)  FIM - Locomotion: Wheelchair Locomotion: Wheelchair: 0: Activity did not occur FIM - Locomotion: Ambulation Locomotion: Ambulation Assistive Devices: Walker - Rolling;Orthosis (hand splint) Ambulation/Gait Assistance: 3: Mod assist Locomotion: Ambulation: 2: Travels 50 - 149 ft with moderate assistance (Pt: 50 - 74%)  Comprehension Comprehension Mode: Auditory Comprehension: 5-Understands basic 90% of the time/requires cueing < 10% of the time  Expression Expression Mode: Verbal Expression: 5-Expresses basic 90% of the time/requires cueing < 10% of the time.  Social Interaction Social Interaction: 6-Interacts appropriately with others with medication or extra time (anti-anxiety, antidepressant).  Problem Solving Problem Solving: 5-Solves basic 90% of the time/requires cueing < 10% of the  time  Memory Memory: 4-Recognizes or recalls 75 - 89% of the time/requires cueing 10 - 24% of the time  Medical Problem List and Plan:  1. Right frontal parietal CVA after ERCP , spasticity an issue will add zanaflex 2. DVT Prophylaxis/Anticoagulation: SCDs. Monitor for any signs of  DVT.  3. Acute non-ST segment elevation myocardial infarction. Cardiac cath unremarkable. No complaints of chest pain or shortness of breath. Continue medical management with aspirin and Xarelto, trying to get pt assistance  4. Obstructive jaundice/history ileostomy. Monitor LFTs and routine care of ileostomy. Continue Protonix  -pt,wife had been independent with ostomy care PTA  -serial labs  5. Hypertension. Lisinopril 10 mg daily and Lopressor 25 mg twice a day. Monitor with increased mobility hild lisinopril today for BP 100 systolic 6. PAF. Xarelto 20 mg daily. Cardiac rate controlled , monitor renal fx 6. Hyperlipidemia. Crestor  7. Dysphagia. Currently on dysphagia 3 nectar liquids. We'll have speech therapy followup  8. Tone: ROM, splinting, tizanidine improved clonus but developing L ankle contracture needs PRAFO 9.  Leukocytosis likely secondary to common bile duct obstruction , afeb, monitor WBC completed ceftin 10. Insomnia improved on elavil  LOS (Days) 15 A FACE TO FACE EVALUATION WAS PERFORMED  Alex Melodye Swor 11/04/2011, 8:12 AM

## 2011-11-04 NOTE — Progress Notes (Signed)
OccupationalTherapy Note  Patient Details  Name: Shawn Wiley MRN: [id number] Date of Birth: [birth year] Today's Date: 11/04/2011  Time:  1300-1400  (60 min) Group therapy Pain:  None  Engaged in LUE functional activities with dominoes.  Pt demonstrated maximal assist with manipulation of objects, but great persistence in tasks.  Pt. Able to move shouler forward in flexion about 80 degrees.    Humberto Seals 11/04/2011, 6:20 PM

## 2011-11-05 MED ORDER — LISINOPRIL 5 MG PO TABS
5.0000 mg | ORAL_TABLET | Freq: Every day | ORAL | Status: DC
  Administered 2011-11-06 – 2011-11-10 (×5): 5 mg via ORAL
  Filled 2011-11-05 (×6): qty 1

## 2011-11-05 MED ORDER — METOPROLOL TARTRATE 12.5 MG HALF TABLET
12.5000 mg | ORAL_TABLET | Freq: Two times a day (BID) | ORAL | Status: DC
  Administered 2011-11-06 – 2011-11-10 (×9): 12.5 mg via ORAL
  Filled 2011-11-05 (×11): qty 1

## 2011-11-05 NOTE — Progress Notes (Signed)
Physical Therapy Note  Patient Details  Name: Shawn Wiley MRN: [id number] Date of Birth: 09/07/1935 Today's Date: 11/05/2011   Group Therapy Pain:  None Time:  1400-1500 (60 min)  Engaged in therapeutic activity  And neuromuscular education to address LUE.  Use bocce ball as activity.  Pt able to hold ball with max assist with LUE and throw.  Did weight bearing with LUE in sitting which pt does well.  Did sit to stand and standing balance.  Pt was minimal assist with this.       Humberto Seals 11/05/2011, 9:51 AM

## 2011-11-05 NOTE — Progress Notes (Signed)
Patient ID: Shawn Wiley, male   DOB: 02/10/1936, 76 y.o.   MRN: [id number]  Subjective/Complaints: No complaints this am. Slept well Review of Systems  Respiratory: Positive for cough and sputum production. Negative for hemoptysis, shortness of breath and wheezing.   All other systems reviewed and are negative.   Objective: Vital Signs: Blood pressure 99/58, pulse 73, temperature 97.9 F (36.6 C), temperature source Oral, resp. rate 16, height 5\' 10"  (1.778 m), weight 168 lb 10.4 oz (76.5 kg), SpO2 98.00%. No results found. No results found for this or any previous visit (from the past 72 hour(s)).   HEENT: normal Cardio: RRR Resp: CTA B/L GI: BS positive and RLQ colostomy Extremity:  No Edema Skin:   Intact Neuro: Alert/Oriented, Abnormal Sensory tingling with Lt touch LUE and LLE and Abnormal Motor 3-/5 with flexor synergy LUE, 2-/5 with ext synergy LLE except 0/5 L ankle Musc/Skel:  Normal Tone ashworth 3 at triceps, 2 in biceps and finger/wrist flexors Unable to oppose finger to thumb New 3-/5 L Wrist extensor strength Assessment/Plan: 1. Functional deficits secondary to R frontal and pariental infarct as well as decondtioning which require 3+ hours per day of interdisciplinary therapy in a comprehensive inpatient rehab setting. Physiatrist is providing close team supervision and 24 hour management of active medical problems listed below. Physiatrist and rehab team continue to assess barriers to discharge/monitor patient progress toward functional and medical goals. FIM: FIM - Bathing Bathing Steps Patient Completed: Chest;Right Arm;Left Arm;Abdomen;Front perineal area;Buttocks;Right upper leg;Left upper leg;Right lower leg (including foot);Left lower leg (including foot) Bathing: 3: Mod-Patient completes 5-7 22f 10 parts or 50-74%  FIM - Upper Body Dressing/Undressing Upper body dressing/undressing steps patient completed: Thread/unthread right sleeve of pullover  shirt/dresss;Thread/unthread left sleeve of pullover shirt/dress;Put head through opening of pull over shirt/dress;Pull shirt over trunk Upper body dressing/undressing: 5: Set-up assist to: Obtain clothing/put away FIM - Lower Body Dressing/Undressing Lower body dressing/undressing steps patient completed: Thread/unthread right underwear leg;Thread/unthread left underwear leg;Pull underwear up/down;Thread/unthread right pants leg;Thread/unthread left pants leg;Pull pants up/down;Don/Doff right shoe;Don/Doff left shoe;Fasten/unfasten right shoe;Fasten/unfasten left shoe Lower body dressing/undressing: 4: Min-Patient completed 75 plus % of tasks  FIM - Toileting Toileting steps completed by patient: Performs perineal hygiene Toileting Assistive Devices:  (urinal is not an assistive device for toileting) Toileting: 2: Max-Patient completed 1 of 3 steps  FIM - Diplomatic Services operational officer Devices: Grab bars Toilet Transfers: 4-To toilet/BSC: Min A (steadying Pt. > 75%);4-From toilet/BSC: Min A (steadying Pt. > 75%)  FIM - Bed/Chair Transfer Bed/Chair Transfer Assistive Devices: Arm rests Bed/Chair Transfer: 4: Chair or W/C > Bed: Min A (steadying Pt. > 75%)  FIM - Locomotion: Wheelchair Locomotion: Wheelchair: 0: Activity did not occur FIM - Locomotion: Ambulation Locomotion: Ambulation Assistive Devices: Walker - Rolling;Orthosis (hand splint) Ambulation/Gait Assistance: 3: Mod assist Locomotion: Ambulation: 2: Travels 50 - 149 ft with moderate assistance (Pt: 50 - 74%)  Comprehension Comprehension Mode: Auditory Comprehension: 5-Understands basic 90% of the time/requires cueing < 10% of the time  Expression Expression Mode: Verbal Expression: 5-Expresses basic 90% of the time/requires cueing < 10% of the time.  Social Interaction Social Interaction: 6-Interacts appropriately with others with medication or extra time (anti-anxiety, antidepressant).  Problem  Solving Problem Solving: 5-Solves basic 90% of the time/requires cueing < 10% of the time  Memory Memory: 4-Recognizes or recalls 75 - 89% of the time/requires cueing 10 - 24% of the time  Medical Problem List and Plan:  1. Right frontal parietal  CVA after ERCP , spasticity an issue will add zanaflex 2. DVT Prophylaxis/Anticoagulation: SCDs. Monitor for any signs of DVT.  3. Acute non-ST segment elevation myocardial infarction. Cardiac cath unremarkable. No complaints of chest pain or shortness of breath. Continue medical management with aspirin and Xarelto, trying to get pt assistance  4. Obstructive jaundice/history ileostomy. Monitor LFTs and routine care of ileostomy. Continue Protonix  -pt,wife had been independent with ostomy care PTA  -serial labs  5. Hypertension. Lisinopril 10 mg daily and Lopressor 25 mg twice a day. Monitor with increased mobility hild lisinopril today for BP 100 systolic 6. PAF. Xarelto 20 mg daily. Cardiac rate controlled , monitor renal fx 6. Hyperlipidemia. Crestor  7. Dysphagia. Currently on dysphagia 3 nectar liquids. We'll have speech therapy followup  8. Tone: ROM, splinting, tizanidine improved clonus but developing L ankle contracture needs PRAFO 9.  Leukocytosis likely secondary to common bile duct obstruction , afeb, monitor WBC completed ceftin 10. Insomnia improved on elavil. Cont Rx  LOS (Days) 16 A FACE TO FACE EVALUATION WAS PERFORMED  Shawn Wiley 11/05/2011, 8:31 AM

## 2011-11-06 LAB — CBC WITH DIFFERENTIAL/PLATELET
Basophils Relative: 1 % (ref 0–1)
Eosinophils Relative: 3 % (ref 0–5)
HCT: 38.4 % — ABNORMAL LOW (ref 39.0–52.0)
Hemoglobin: 13.5 g/dL (ref 13.0–17.0)
MCH: 30.8 pg (ref 26.0–34.0)
MCV: 87.7 fL (ref 78.0–100.0)
Monocytes Absolute: 1.1 10*3/uL — ABNORMAL HIGH (ref 0.1–1.0)
Neutro Abs: 5.6 10*3/uL (ref 1.7–7.7)
Neutrophils Relative %: 66 % (ref 43–77)
RBC: 4.38 MIL/uL (ref 4.22–5.81)

## 2011-11-06 NOTE — Plan of Care (Signed)
Problem: RH SKIN INTEGRITY Goal: RH STG SKIN FREE OF INFECTION/BREAKDOWN Min assist  Outcome: Progressing Pt rash to peri area will heal with min assist

## 2011-11-06 NOTE — Progress Notes (Signed)
Physical Therapy Note  Patient Details  Name: Shawn Wiley MRN: [id number] Date of Birth: 06-26-1935 Today's Date: 11/06/2011  15:00-16:30 individual therapy pt denied pain.  Hands on family education performed with wife for squat transfers, bed mobility with close supervision. Pt practiced getting into a high bed to simulate home. Pt uses a step stool to scoot back onto bed. Initiated gait with wife using rw. Pt ambulated 10' including turns to wc. Pt with 1 LOB due to pt moving wc at the same time as feet when backing. Therapist assisted with pt regaining balance. Wife had multiple questions about home set up and long term prognosis. They were educated about quieting environment to allow improved attention to gait and transfers. Wife needs more practice with gait, steps, car transfer, and exercises to reduce tone.   Julian Reil 11/06/2011, 4:50 PM

## 2011-11-06 NOTE — Progress Notes (Signed)
Physical Therapy Note  Patient Details  Name: Shawn Wiley MRN: [id number] Date of Birth: January 20, 1936 Today's Date: 11/06/2011  1100-1130 (30 minutes) individual Pain: no complaint of pain Focus of treatment: gait training/endurance with RW ; up/down curb with RW Treatment: Gait training- 80 feet X 2 with RW + Lt hand orthosis min assist with vcs for safe foot placement during turning and stepping backward; up/down curb with RW min assist and instructional cues for technique. Wife present.   Prue Lingenfelter,JIM 11/06/2011, 11:30 AM

## 2011-11-06 NOTE — Progress Notes (Signed)
Patient ID: Shawn Wiley, male   DOB: [birth year], 76 y.o.   MRN: [id number]  Subjective/Complaints: No spasms no pain Review of Systems  Respiratory: Positive for cough and sputum production. Negative for hemoptysis, shortness of breath and wheezing.   All other systems reviewed and are negative.   Objective: Vital Signs: Blood pressure 127/78, pulse 85, temperature 98.2 F (36.8 C), temperature source Oral, resp. rate 16, height 5\' 10"  (1.778 m), weight 76.5 kg (168 lb 10.4 oz), SpO2 97.00%. No results found. No results found for this or any previous visit (from the past 72 hour(s)).   HEENT: normal Cardio: RRR Resp: CTA B/L GI: BS positive and RLQ colostomy Extremity:  No Edema Skin:   Intact Neuro: Alert/Oriented, Abnormal Sensory tingling with Lt touch LUE and LLE and Abnormal Motor 3-/5 with flexor synergy LUE, 2-/5 with ext synergy LLE except 0/5 L ankle Musc/Skel:  Normal Tone ashworth 3 at triceps, 2 in biceps and finger/wrist flexors Unable to oppose finger to thumb New 3-/5 L Wrist extensor strength, 3-/5 biceps and triceps Assessment/Plan: 1. Functional deficits secondary to R frontal and pariental infarct as well as decondtioning which require 3+ hours per day of interdisciplinary therapy in a comprehensive inpatient rehab setting. Physiatrist is providing close team supervision and 24 hour management of active medical problems listed below. Physiatrist and rehab team continue to assess barriers to discharge/monitor patient progress toward functional and medical goals. FIM: FIM - Bathing Bathing Steps Patient Completed: Chest;Right Arm;Left Arm;Abdomen;Front perineal area;Buttocks;Right upper leg;Left upper leg;Right lower leg (including foot);Left lower leg (including foot) Bathing: 3: Mod-Patient completes 5-7 68f 10 parts or 50-74%  FIM - Upper Body Dressing/Undressing Upper body dressing/undressing steps patient completed: Thread/unthread right sleeve of pullover  shirt/dresss;Thread/unthread left sleeve of pullover shirt/dress;Put head through opening of pull over shirt/dress;Pull shirt over trunk Upper body dressing/undressing: 5: Set-up assist to: Obtain clothing/put away FIM - Lower Body Dressing/Undressing Lower body dressing/undressing steps patient completed: Thread/unthread right underwear leg;Thread/unthread left underwear leg;Pull underwear up/down;Thread/unthread right pants leg;Thread/unthread left pants leg;Pull pants up/down;Don/Doff right shoe;Don/Doff left shoe;Fasten/unfasten right shoe;Fasten/unfasten left shoe Lower body dressing/undressing: 4: Min-Patient completed 75 plus % of tasks  FIM - Toileting Toileting steps completed by patient: Performs perineal hygiene Toileting Assistive Devices:  (urinal is not an assistive device for toileting) Toileting: 2: Max-Patient completed 1 of 3 steps  FIM - Diplomatic Services operational officer Devices: Grab bars Toilet Transfers: 4-To toilet/BSC: Min A (steadying Pt. > 75%);4-From toilet/BSC: Min A (steadying Pt. > 75%)  FIM - Bed/Chair Transfer Bed/Chair Transfer Assistive Devices: Arm rests Bed/Chair Transfer: 4: Chair or W/C > Bed: Min A (steadying Pt. > 75%)  FIM - Locomotion: Wheelchair Locomotion: Wheelchair: 0: Activity did not occur FIM - Locomotion: Ambulation Locomotion: Ambulation Assistive Devices: Walker - Rolling;Orthosis (hand splint) Ambulation/Gait Assistance: 3: Mod assist Locomotion: Ambulation: 2: Travels 50 - 149 ft with moderate assistance (Pt: 50 - 74%)  Comprehension Comprehension Mode: Auditory Comprehension: 5-Understands complex 90% of the time/Cues < 10% of the time  Expression Expression Mode: Verbal Expression: 5-Expresses complex 90% of the time/cues < 10% of the time  Social Interaction Social Interaction: 6-Interacts appropriately with others with medication or extra time (anti-anxiety, antidepressant).  Problem Solving Problem Solving:  5-Solves basic 90% of the time/requires cueing < 10% of the time  Memory Memory: 4-Recognizes or recalls 75 - 89% of the time/requires cueing 10 - 24% of the time  Medical Problem List and Plan:  1. Right frontal parietal  CVA after ERCP , spasticity an issue will add zanaflex 2. DVT Prophylaxis/Anticoagulation: SCDs. Monitor for any signs of DVT.  3. Acute non-ST segment elevation myocardial infarction. Cardiac cath unremarkable. No complaints of chest pain or shortness of breath. Continue medical management with aspirin and Xarelto, trying to get pt assistance  4. Obstructive jaundice/history ileostomy. Monitor LFTs and routine care of ileostomy. Continue Protonix  -pt,wife had been independent with ostomy care PTA  -serial labs  5. Hypertension. Lisinopril 10 mg daily and Lopressor 25 mg twice a day. Monitor with increased mobility hild lisinopril today for BP 100 systolic 6. PAF. Xarelto 20 mg daily. Cardiac rate controlled , monitor renal fx 6. Hyperlipidemia. Crestor  7. Dysphagia. Currently on dysphagia 3 nectar liquids. We'll have speech therapy followup  8. Tone: ROM, splinting, tizanidine improved clonus but developing L ankle contracture needs PRAFO 9.  Leukocytosis likely secondary to common bile duct obstruction , afeb, monitor WBC completed ceftin 10. Insomnia improved on elavil  LOS (Days) 17 A FACE TO FACE EVALUATION WAS PERFORMED  KIRSTEINS,ANDREW E 11/06/2011, 7:38 AM

## 2011-11-06 NOTE — Progress Notes (Signed)
Rash noted to bilateral groin, micro-guard powder applied.

## 2011-11-06 NOTE — Progress Notes (Signed)
Occupational Therapy Session Note  Patient Details  Name: Shawn Wiley MRN: [id number] Date of Birth: [birth year]  Today's Date: 11/06/2011 Time: 3875-6433 (1030-1100) Time Calculation (min): 60 min (30 min)  Short Term Goals: Week 1:  OT Short Term Goal 1 (Week 1): Patient will complete transfers functional transfers with min assist  OT Short Term Goal 1 - Progress (Week 1): Progressing toward goal OT Short Term Goal 2 (Week 1): Patient will complete upper body bathing with supervision and setup  OT Short Term Goal 2 - Progress (Week 1): Progressing toward goal OT Short Term Goal 3 (Week 1): Patient will complete lower body bathing with min assist  OT Short Term Goal 3 - Progress (Week 1): Met OT Short Term Goal 4 (Week 1): Patient will complete upper body dressing with min assist  OT Short Term Goal 4 - Progress (Week 1): Met OT Short Term Goal 5 (Week 1): Patient will complete lower body dressing with moderate assist OT Short Term Goal 5 - Progress (Week 1): Met Week 2:  OT Short Term Goal 1 (Week 2): Patient will complete transfers functional transfers with min assist  OT Short Term Goal 2 (Week 2): Patient will complete upper body bathing with supervision and setup  OT Short Term Goal 3 (Week 2): Patient will increase A/ROM of L UE to increase functional performance during ADL tasks.  Skilled Therapeutic Interventions/Progress Updates:  First Session: ADL re-training performed this AM. Patient performed bathing and shower transfer well without the need for vc's for technique or safety. UB hemi-dressing required some set-up and vc's for technique. Patient will be given long handled sponge for use at next ADL session. -Yellow Theraputty HEP performed with LUE; Mod A for movement and technique at times. vc's to perform exercises. Patient having the greatest difficulty with finger extension and individual finger pinch.  Second Session:  -Patient completed ROM Arc with LUE on 1st level  to increase grasp and release of pointer finger and thumb and horizontal shoulder abduction/adduction; Min A for LUE motor control; vc's for technique; patient rested after every 2-3 rings; 2 trials (right to left then left to right); >avg time; Mod difficulty. -Velcro block board completed with LUE: patient practiced pinching with pointer finger and thumb to grab loop on block, pull off board and release block into a pile on table top; Min A-contact guard for LUE motor control; all blocks removed only; >avg time; min difficulty.  Therapy Documentation Precautions:  Precautions Precautions: Fall Precaution Comments: abnormal LUE & LLE tone Restrictions Weight Bearing Restrictions: No Pain: Pain Assessment Pain Assessment: No/denies pain  See FIM for current functional status  Therapy/Group: Individual Therapy  Arda Daggs, Charisse March 11/06/2011, 11:19 AM

## 2011-11-06 NOTE — Progress Notes (Signed)
Per State Regulation 482.30 This chart was reviewed for medical necessity with respect to the patient's Admission/Duration of stay. Pt continues to progress in therapies. Monitoring BP; med adjusted.  Meryl Dare                 Nurse Care Manager            Next Review Date: 11/09/11

## 2011-11-06 NOTE — Progress Notes (Signed)
Speech Language Pathology Daily Session Note  Patient Details  Name: Shawn Wiley MRN: [id number] Date of Birth: 1935-09-30  Today's Date: 11/06/2011 Time: 0950-1030 Time Calculation (min): 40 min  Short Term Goals: Week 2: SLP Short Term Goal 1 (Week 2): Pt will demonstrate functional problem solving for basic tasks with supervision cues SLP Short Term Goal 1 - Progress (Week 2): Progressing toward goal SLP Short Term Goal 2 (Week 2): Pt will utilize speech intelligibility strategies at conversational level with mod I verbal cues. SLP Short Term Goal 2 - Progress (Week 2): Met SLP Short Term Goal 3 (Week 2): Pt will tolerate trials of soft mechanical consistencies with mod I. SLP Short Term Goal 3 - Progress (Week 2): Met  Skilled Therapeutic Interventions: Treatment session focused on speech intelligibility and problem solving with basic tasks; SLP facicalited session with increased wait time for verbal expression and basic problem solving during wheel chair mobility with cue x1 to lock left break.  SLP also facilitated session with supervision semantic cue sto utilize left upper extremity duing funcitonal tasks.    FIM:  Comprehension Comprehension Mode: Auditory Comprehension: 5-Understands complex 90% of the time/Cues < 10% of the time Expression Expression Mode: Verbal Expression: 6-Expresses complex ideas: With extra time/assistive device Social Interaction Social Interaction: 6-Interacts appropriately with others with medication or extra time (anti-anxiety, antidepressant). Problem Solving Problem Solving: 5-Solves basic problems: With no assist Memory Memory: 4-Recognizes or recalls 75 - 89% of the time/requires cueing 10 - 24% of the time  Pain Pain Assessment Pain Assessment: No/denies pain Pain Score: 0-No pain  Therapy/Group: Individual Therapy  Charlane Ferretti., CCC-SLP [phone]  Shawn Wiley 11/06/2011, 12:36 PM

## 2011-11-07 DIAGNOSIS — G8110 Spastic hemiplegia affecting unspecified side: Secondary | ICD-10-CM

## 2011-11-07 DIAGNOSIS — Z5189 Encounter for other specified aftercare: Secondary | ICD-10-CM

## 2011-11-07 DIAGNOSIS — I6330 Cerebral infarction due to thrombosis of unspecified cerebral artery: Secondary | ICD-10-CM

## 2011-11-07 NOTE — Progress Notes (Signed)
Nutrition Follow-up  Intervention:   1.  Meals/snacks; ensure pt's vegetable are served at appropriate consistency/texture.  RD verified diet order and placed notice in [facility] regarding pt's preference.  Assessment:   Pt continues to consume 90-100% of meals.  Pt states that his meats are served chopped, however his vegetables have been pureed which he does not prefer.  Pt has also been on thin liquids for >2 weeks with tolerance.  Pt feels his appetite and intake are adequate, and is comfortable managing his nutrition. RD spoke with food service representative who confirms pt's diet order and noted pt's preferences.  Pt with large ileostomy output, which per RN is appropriate.  Pt output is ~516mL/day which is wnl.  Diet Order:  Regular, thin  Meds: Scheduled Meds:   . amitriptyline  25 mg Oral QHS  . antiseptic oral rinse  15 mL Mouth Rinse BID  . aspirin EC  81 mg Oral Daily  . atorvastatin  20 mg Oral q1800  . B-complex with vitamin C  1 tablet Oral Daily  . bacitracin  1 application Topical BID  . lisinopril  5 mg Oral Daily  . metoprolol tartrate  12.5 mg Oral BID  . multivitamins with iron  1 tablet Oral Daily  . pantoprazole  40 mg Oral Daily  . rivaroxaban  20 mg Oral QPC supper  . tiZANidine  2 mg Oral TID  . traZODone  50 mg Oral Once   Continuous Infusions:  PRN Meds:.acetaminophen, diphenhydrAMINE, nitroGLYCERIN, ondansetron (ZOFRAN) IV, ondansetron, polyethylene glycol, sorbitol  Labs:  No new labs since (6/24)   Intake/Output Summary (Last 24 hours) at 11/07/11 1033 Last data filed at 11/06/11 2007  Gross per 24 hour  Intake    480 ml  Output    440 ml  Net     40 ml    Weight Status:  Stable at 168 lbs Admission wt (6/21): 172 lbs  Restatement of needs:  1785-2000, 82-98g protein  Nutrition Dx:  Increased nutrient needs, ongoing  Monitor:   1.  Energy intake; pt meeting >/= 90% of estimated needs.  Met, ongoing. 2.  Wt/wt change; monitor  trends, promote maintenance.  Met, ongoing. 3.  Gastrointestinal; stool output appropriate for ileostomy.  Met, pt with ~500 mL output/day, ongoing.   Loyce Dys, MS RD LDN Clinical Inpatient Dietitian Pager: [phone]

## 2011-11-07 NOTE — Progress Notes (Signed)
Patient ID: Shawn Wiley, male   DOB: 01-Jul-1935, 76 y.o.   MRN: [id number]  Subjective/Complaints: No spasms no pain Review of Systems  Respiratory: Positive for cough and sputum production. Negative for hemoptysis, shortness of breath and wheezing.   All other systems reviewed and are negative.   Objective: Vital Signs: Blood pressure 122/81, pulse 70, temperature 97.7 F (36.5 C), temperature source Oral, resp. rate 20, height 5\' 10"  (1.778 m), weight 76.5 kg (168 lb 10.4 oz), SpO2 98.00%. No results found. Results for orders placed during the hospital encounter of 10/20/11 (from the past 72 hour(s))  CBC WITH DIFFERENTIAL     Status: Abnormal   Collection Time   11/06/11  9:27 AM      Component Value Range Comment   WBC 8.6  4.0 - 10.5 K/uL WHITE COUNT CONFIRMED ON SMEAR   RBC 4.38  4.22 - 5.81 MIL/uL    Hemoglobin 13.5  13.0 - 17.0 g/dL    HCT 40.9 (*) 81.1 - 52.0 %    MCV 87.7  78.0 - 100.0 fL    MCH 30.8  26.0 - 34.0 pg    MCHC 35.2  30.0 - 36.0 g/dL    RDW 91.4  78.2 - 95.6 %    Platelets 295  150 - 400 K/uL    Neutrophils Relative 66  43 - 77 %    Lymphocytes Relative 17  12 - 46 %    Monocytes Relative 13 (*) 3 - 12 %    Eosinophils Relative 3  0 - 5 %    Basophils Relative 1  0 - 1 %    Neutro Abs 5.6  1.7 - 7.7 K/uL    Lymphs Abs 1.5  0.7 - 4.0 K/uL    Monocytes Absolute 1.1 (*) 0.1 - 1.0 K/uL    Eosinophils Absolute 0.3  0.0 - 0.7 K/uL    Basophils Absolute 0.1  0.0 - 0.1 K/uL    Smear Review MORPHOLOGY UNREMARKABLE        HEENT: normal Cardio: RRR Resp: CTA B/L GI: BS positive and RLQ colostomy Extremity:  No Edema Skin:   Intact Neuro: Alert/Oriented, Abnormal Sensory tingling with Lt touch LUE and LLE and Abnormal Motor 3-/5 with flexor synergy LUE, 2-/5 with ext synergy LLE except 0/5 L ankle Musc/Skel:  Normal Tone ashworth 3 at triceps, 2 in biceps and finger/wrist flexors Unable to oppose finger to thumb New 3-/5 L Wrist extensor strength, 3-/5  biceps and triceps Assessment/Plan: 1. Functional deficits secondary to R frontal and pariental infarct as well as decondtioning which require 3+ hours per day of interdisciplinary therapy in a comprehensive inpatient rehab setting. Physiatrist is providing close team supervision and 24 hour management of active medical problems listed below. Physiatrist and rehab team continue to assess barriers to discharge/monitor patient progress toward functional and medical goals. FIM: FIM - Bathing Bathing Steps Patient Completed: Chest;Right Arm;Left Arm;Abdomen;Front perineal area;Buttocks;Right upper leg;Left upper leg;Right lower leg (including foot);Left lower leg (including foot) Bathing: 3: Mod-Patient completes 5-7 39f 10 parts or 50-74%  FIM - Upper Body Dressing/Undressing Upper body dressing/undressing steps patient completed: Thread/unthread right sleeve of pullover shirt/dresss;Thread/unthread left sleeve of pullover shirt/dress;Put head through opening of pull over shirt/dress;Pull shirt over trunk Upper body dressing/undressing: 5: Supervision: Safety issues/verbal cues FIM - Lower Body Dressing/Undressing Lower body dressing/undressing steps patient completed: Thread/unthread right underwear leg;Thread/unthread left underwear leg;Pull underwear up/down;Thread/unthread right pants leg;Thread/unthread left pants leg;Pull pants up/down;Don/Doff right shoe;Don/Doff left shoe;Fasten/unfasten  right shoe;Fasten/unfasten left shoe Lower body dressing/undressing: 4: Min-Patient completed 75 plus % of tasks  FIM - Toileting Toileting steps completed by patient: Performs perineal hygiene Toileting Assistive Devices:  (urinal is not an assistive device for toileting) Toileting: 2: Max-Patient completed 1 of 3 steps  FIM - Diplomatic Services operational officer Devices: Grab bars Toilet Transfers: 4-To toilet/BSC: Min A (steadying Pt. > 75%);4-From toilet/BSC: Min A (steadying Pt. > 75%)  FIM  - Bed/Chair Transfer Bed/Chair Transfer Assistive Devices: Arm rests Bed/Chair Transfer: 4: Chair or W/C > Bed: Min A (steadying Pt. > 75%)  FIM - Locomotion: Wheelchair Locomotion: Wheelchair: 0: Activity did not occur FIM - Locomotion: Ambulation Locomotion: Ambulation Assistive Devices: Walker - Rolling;Orthosis (hand splint) Ambulation/Gait Assistance: 3: Mod assist Locomotion: Ambulation: 2: Travels 50 - 149 ft with moderate assistance (Pt: 50 - 74%)  Comprehension Comprehension Mode: Auditory Comprehension: 5-Understands basic 90% of the time/requires cueing < 10% of the time  Expression Expression Mode: Verbal Expression: 5-Expresses complex 90% of the time/cues < 10% of the time  Social Interaction Social Interaction: 5-Interacts appropriately 90% of the time - Needs monitoring or encouragement for participation or interaction.  Problem Solving Problem Solving: 5-Solves basic problems: With no assist  Memory Memory: 4-Recognizes or recalls 75 - 89% of the time/requires cueing 10 - 24% of the time  Medical Problem List and Plan:  1. Right frontal parietal CVA after ERCP , spasticity an issue will add zanaflex 2. DVT Prophylaxis/Anticoagulation: SCDs. Monitor for any signs of DVT.  3. Acute non-ST segment elevation myocardial infarction. Cardiac cath unremarkable. No complaints of chest pain or shortness of breath. Continue medical management with aspirin and Xarelto, trying to get pt assistance  4. Obstructive jaundice/history ileostomy. Monitor LFTs and routine care of ileostomy. Continue Protonix  -pt,wife had been independent with ostomy care PTA  -serial labs  5. Hypertension. Lisinopril 10 mg daily and Lopressor 25 mg twice a day. Monitor with increased mobility hild lisinopril today for BP 100 systolic 6. PAF. Xarelto 20 mg daily. Cardiac rate controlled , monitor renal fx 6. Hyperlipidemia. Crestor  7. Dysphagia. Currently on dysphagia 3 nectar liquids. We'll have  speech therapy followup  8. Tone: ROM, splinting, tizanidine improved clonus but developing L ankle contracture needs PRAFO 9.  Leukocytosis resolved           10. Insomnia improved on elavil  LOS (Days) 18 A FACE TO FACE EVALUATION WAS PERFORMED  Caydan Mctavish E 11/07/2011, 7:14 AM

## 2011-11-07 NOTE — Progress Notes (Signed)
Speech Language Pathology Daily Session Note & Discharge Summary  Patient Details  Name: Shawn Wiley MRN: [id number] Date of Birth: July 01, 1935  Today's Date: 11/07/2011 Time: 4782-9562 Time Calculation (min): 30 min  Short Term Goals: Week 1: SLP Short Term Goal 1 (Week 1): Pt will demosntrate funcitonal problem solving for basic tasks with supervision verbal and questioning cues.  SLP Short Term Goal 1 - Progress (Week 1): Progressing toward goal SLP Short Term Goal 2 (Week 1): Pt will utilize speech intelligbility strategies at the conversation level with supervision verbal and questioning cues.  SLP Short Term Goal 2 - Progress (Week 1): Met SLP Short Term Goal 3 (Week 1): Pt will consume trials of thin liquids without overt s/s of aspiraiton with Min verbal cueing.  SLP Short Term Goal 3 - Progress (Week 1): Met  Skilled Therapeutic Interventions: Treatment session focused on spech intelligibility during divided attention task; Patietnt required increased wait time to sincrease vocal intensity and therefore intelligibility during functional tasks.  SLP facilitated session with supervision semntic uces to attend to left upper extremity duirng various tasks and wife was able to return demonstration of cuing.  Wife reports that patient's mastication is at baseline and they are happy with his speech.  At this time, wife ia able to provide necessary level of assist and SLP recommended discharge from services; patient and wife in agreement.    FIM:  Comprehension Comprehension Mode: Auditory Comprehension: 5-Follows basic conversation/direction: With extra time/assistive device Expression Expression Mode: Verbal Expression: 6-Expresses complex ideas: With extra time/assistive device Social Interaction Social Interaction: 6-Interacts appropriately with others with medication or extra time (anti-anxiety, antidepressant). Problem Solving Problem Solving: 5-Solves basic problems: With no  assist Memory Memory: 6-Assistive device: No helper FIM - Eating Eating Activity: 5: Set-up assist for open containers  Pain Pain Assessment Pain Assessment: No/denies pain Pain Score: 0-No pain  Therapy/Group: Individual Therapy   Speech Language Pathology Discharge Summary  Patient Details  Name: Shawn Wiley MRN: [id number] Date of Birth: June 02, 1935  Today's Date: 11/07/2011  Patient has met 4 of 4 long term goals.  Patient to discharge at overall Modified Independent;Supervision level.  Reasons goals not met: N/A   Clinical Impression/Discharge Summary: Patient has made funcitonla gains in the areas of speech intelligibility, awareness, problem solving and dysphagia.  He is currently, Mod I with speech expression, requesting help as needed and requires supervision cues to attend to left upper extremity during functional tasks and per patient and wife report his is able to eat as well as PTA.  No follow up is recommended at this time and due to waife's ability to provide cuing as needed..  Care Partner:  Caregiver Able to Provide Assistance: Yes  Type of Caregiver Assistance: Cognitive  Recommendation:  None     Equipment: None   Reasons for discharge: Treatment goals met   Patient/Family Agrees with Progress Made and Goals Achieved: Yes   See FIM for current functional status  Charlane Ferretti., CCC-SLP [phone]  Twanda Stakes 11/07/2011, 12:18 PM

## 2011-11-07 NOTE — Progress Notes (Signed)
Occupational Therapy Session Note  Patient Details  Name: Shawn Wiley MRN: [id number] Date of Birth: 04-24-1936  Today's Date: 11/07/2011 Time: 4098-1191 Time Calculation (min): 45 min  Short Term Goals: Week 2:  OT Short Term Goal 1 (Week 2): Patient will complete transfers functional transfers with min assist  OT Short Term Goal 2 (Week 2): Patient will complete upper body bathing with supervision and setup  OT Short Term Goal 3 (Week 2): Patient will increase A/ROM of L UE to increase functional performance during ADL tasks.  Skilled Therapeutic Interventions/Progress Updates:    1:1 neuromuscular reeducation: focus on AAROM with left UE while engaging in game of checkers on upright board; focusing on grasp and release, normal patterns of movement - avoiding hiking his shoulder with shoulder flexion; shoulder flexion/ extension, elbow flexion and extension, wrist mobility, supination/ pronation, isolated finger movement, pincher grasp and release. Then performed PNF patterns with left UE with AAROM in sitting, in standing performed side stepping weight bearing through bilateral UEs on the wall for support, wall push ups with emphasis on symmetrical mobility.  Therapy Documentation Precautions:  Precautions Precautions: Fall Precaution Comments: abnormal LUE & LLE tone Restrictions Weight Bearing Restrictions: No Pain: Pain Assessment Pain Assessment: 0-10 Pain Score:   7 Pain Type: Acute pain Pain Location: Head Pain Orientation: Anterior Pain Descriptors: Headache Pain Frequency: Occasional Pain Onset: Gradual Patients Stated Pain Goal: 2 Pain Intervention(s): Medication (See eMAR) (tylenol 650mg ) Multiple Pain Sites: No  See FIM for current functional status  Therapy/Group: Individual Therapy  Roney Mans [facility] 11/07/2011, 3:45 PM

## 2011-11-07 NOTE — Progress Notes (Signed)
Physical Therapy Session Note  Patient Details  Name: Shawn Wiley MRN: [id number] Date of Birth: 1935-08-13  Today's Date: 11/07/2011 Time: 1500-1600 Time Calculation (min): 60 min  Skilled Therapeutic Interventions/Progress Updates:   Wife present for family education; discussed stair situation at home garage vs. Front steps.  Two rails on front but unable to reach both; simulated front by performing up and down 12 steps (8 inches tall) with R rail forwards, L rail to descend with mod A secondary to LLE catching on R heel or on step lip or adducting LLE when stepping down and patient requiring increased assistance to maintain balance and untangle foot to advance it; even with cues to flex knee patient still had episodes of catching foot.  Also performed laterally with bilat UE support on L rail with min-mod A for balance, sequence and safety.  Patient's wife began to cry stating that she was afraid with her own medical issues and weakness she would not be able to assist him safely and that they both would fall or be injured especially if it were raining and they were to slip.    Discussed garage set up; patient and wife feel that they can move some items in the garage to allow her to pull up half way into garage and allow patient to exit passenger side, walk around front of car with RW and use R rail to ascend "handicap steps" (4").  Simulated garage set up in gym; had patient exit car and ambulate with RW to stairs and ascend/descend 4" steps with one rail with min A and verbal cues for sequence and safety; had patient and wife give repeat demonstration.  Wife states she will have rail installed on L side for descent by D/C.  Wife and patient feel safer with this set up and sequence.  Will need to practice again.  Wife also states she will purchase a transport w/c to keep in the car (one she can load and unload) and use rental w/c in the home.    Therapy Documentation Precautions:   Precautions Precautions: Fall Precaution Comments: abnormal LUE & LLE tone Restrictions Weight Bearing Restrictions: No Pain: Pain Assessment Pain Assessment: 0-10 Pain Score:   7 Pain Type: Acute pain Pain Location: Head Pain Orientation: Anterior Pain Descriptors: Headache Pain Frequency: Occasional Pain Onset: Gradual Patients Stated Pain Goal: 2 Pain Intervention(s): Medication (See eMAR) (tylenol 650mg ) Multiple Pain Sites: No  See FIM for current functional status  Therapy/Group: Individual Therapy  Edman Circle Faucette 11/07/2011, 5:01 PM

## 2011-11-07 NOTE — Progress Notes (Signed)
Occupational Therapy Session Note  Patient Details  Name: Shawn Wiley MRN: [id number] Date of Birth: 1936/04/09  Today's Date: 11/07/2011 Time: 0830-0930 Time Calculation (min): 60 min  Short Term Goals: Week 1:  OT Short Term Goal 1 (Week 1): Patient will complete transfers functional transfers with min assist  OT Short Term Goal 1 - Progress (Week 1): Progressing toward goal OT Short Term Goal 2 (Week 1): Patient will complete upper body bathing with supervision and setup  OT Short Term Goal 2 - Progress (Week 1): Progressing toward goal OT Short Term Goal 3 (Week 1): Patient will complete lower body bathing with min assist  OT Short Term Goal 3 - Progress (Week 1): Met OT Short Term Goal 4 (Week 1): Patient will complete upper body dressing with min assist  OT Short Term Goal 4 - Progress (Week 1): Met OT Short Term Goal 5 (Week 1): Patient will complete lower body dressing with moderate assist OT Short Term Goal 5 - Progress (Week 1): Met Week 2:  OT Short Term Goal 1 (Week 2): Patient will complete transfers functional transfers with min assist  OT Short Term Goal 2 (Week 2): Patient will complete upper body bathing with supervision and setup  OT Short Term Goal 3 (Week 2): Patient will increase A/ROM of L UE to increase functional performance during ADL tasks.  Skilled Therapeutic Interventions/Progress Updates:  ADL re-training performed this AM. Patient performed bathing and shower transfer at contact guard assist without the need for vc's for technique or safety. UB hemi-dressing required some set-up and vc's for technique. Patient used long handled sponge during bathing and increased his functional performance. -yellow theraputty; 10/10 beads found; BUE; mod diff incorporating LUE with putty; vc's for technique; >avg time.    Therapy Documentation Precautions:  Precautions Precautions: Fall Precaution Comments: abnormal LUE & LLE tone Restrictions Weight Bearing  Restrictions: No Pain: No reports of pain. See FIM for current functional status  Therapy/Group: Individual Therapy  Rahel Carlton, Charisse March 11/07/2011, 9:49 AM

## 2011-11-08 NOTE — Progress Notes (Signed)
Physical Therapy Note  Patient Details  Name: Shawn Wiley MRN: [id number] Date of Birth: 05/02/1935 Today's Date: 11/08/2011  15:00-16:00 individual therapy  Family education was performed with wife and grandson for gait home environment with rw and hand splint, steps and car transfer.   Julian Reil 11/08/2011, 5:39 PM

## 2011-11-08 NOTE — Progress Notes (Signed)
Occupational Therapy Session Note  Patient Details  Name: TAITUM ALMS MRN: [id number] Date of Birth: 1935/08/12  Today's Date: 11/08/2011 Time: 0830-0930 Time Calculation (min): 60 min  Short Term Goals: Week 1:  OT Short Term Goal 1 (Week 1): Patient will complete transfers functional transfers with min assist  OT Short Term Goal 1 - Progress (Week 1): Progressing toward goal OT Short Term Goal 2 (Week 1): Patient will complete upper body bathing with supervision and setup  OT Short Term Goal 2 - Progress (Week 1): Progressing toward goal OT Short Term Goal 3 (Week 1): Patient will complete lower body bathing with min assist  OT Short Term Goal 3 - Progress (Week 1): Met OT Short Term Goal 4 (Week 1): Patient will complete upper body dressing with min assist  OT Short Term Goal 4 - Progress (Week 1): Met OT Short Term Goal 5 (Week 1): Patient will complete lower body dressing with moderate assist OT Short Term Goal 5 - Progress (Week 1): Met Week 2:  OT Short Term Goal 1 (Week 2): Patient will complete transfers functional transfers with min assist  OT Short Term Goal 2 (Week 2): Patient will complete upper body bathing with supervision and setup  OT Short Term Goal 3 (Week 2): Patient will increase A/ROM of L UE to increase functional performance during ADL tasks.  Skilled Therapeutic Interventions/Progress Updates:  ADL re-training performed this AM. Patient performed bathing and shower transfer at contact guard assist without the need for vc's for technique or safety. No vc's required to complete hemi-dressing. Patient used long handled sponge during bathing. -ROM Arc; 2nd level; performed standing; LUE; patient needed set-up of rings in left hand; vc's for patient to open hand and grab ring. Patient has difficulty performing shoulder flexion with correct scapular alignment. Patient frequently substitutes movement by performing shoulder abduction. Provided verbal and visual cues on  proper shoulder flexion motion. Patient performed correct shoulder flexion motion with  Vc's; 2 sets; 5 reps. Patient educated to perform shoulder motion every hour as instructed. Patient verbalized understanding.   Therapy Documentation Precautions:  Precautions Precautions: Fall Precaution Comments: abnormal LUE & LLE tone Restrictions Weight Bearing Restrictions: No Pain: No reports of pain. See FIM for current functional status  Therapy/Group: Individual Therapy  Shanna Strength, Charisse March, OTR/L 11/08/2011, 9:55 AM

## 2011-11-08 NOTE — Progress Notes (Signed)
Patient ID: Shawn Wiley, male   DOB: 1936/03/28, 76 y.o.   MRN: [id number]  Subjective/Complaints: No spasms no pain Review of Systems  Respiratory: Positive for cough and sputum production. Negative for hemoptysis, shortness of breath and wheezing.   All other systems reviewed and are negative.   Objective: Vital Signs: Blood pressure 116/76, pulse 69, temperature 98 F (36.7 C), temperature source Oral, resp. rate 19, height 5\' 10"  (1.778 m), weight 76.5 kg (168 lb 10.4 oz), SpO2 99.00%. No results found. Results for orders placed during the hospital encounter of 10/20/11 (from the past 72 hour(s))  CBC WITH DIFFERENTIAL     Status: Abnormal   Collection Time   11/06/11  9:27 AM      Component Value Range Comment   WBC 8.6  4.0 - 10.5 K/uL WHITE COUNT CONFIRMED ON SMEAR   RBC 4.38  4.22 - 5.81 MIL/uL    Hemoglobin 13.5  13.0 - 17.0 g/dL    HCT 78.2 (*) 95.6 - 52.0 %    MCV 87.7  78.0 - 100.0 fL    MCH 30.8  26.0 - 34.0 pg    MCHC 35.2  30.0 - 36.0 g/dL    RDW 21.3  08.6 - 57.8 %    Platelets 295  150 - 400 K/uL    Neutrophils Relative 66  43 - 77 %    Lymphocytes Relative 17  12 - 46 %    Monocytes Relative 13 (*) 3 - 12 %    Eosinophils Relative 3  0 - 5 %    Basophils Relative 1  0 - 1 %    Neutro Abs 5.6  1.7 - 7.7 K/uL    Lymphs Abs 1.5  0.7 - 4.0 K/uL    Monocytes Absolute 1.1 (*) 0.1 - 1.0 K/uL    Eosinophils Absolute 0.3  0.0 - 0.7 K/uL    Basophils Absolute 0.1  0.0 - 0.1 K/uL    Smear Review MORPHOLOGY UNREMARKABLE        HEENT: normal Cardio: RRR Resp: CTA B/L GI: BS positive and RLQ colostomy Extremity:  No Edema Skin:   Intact Neuro: Alert/Oriented, Abnormal Sensory tingling with Lt touch LUE and LLE and Abnormal Motor 3-/5 with flexor synergy LUE, 2-/5 with ext synergy LLE except 0/5 L ankle Musc/Skel:  Normal Tone ashworth 3 at triceps, 2 in biceps and finger/wrist flexors Unable to oppose finger to thumb New 3-/5 L Wrist extensor strength, 3-/5  biceps and triceps Assessment/Plan: 1. Functional deficits secondary to R frontal and pariental infarct as well as decondtioning which require 3+ hours per day of interdisciplinary therapy in a comprehensive inpatient rehab setting. Physiatrist is providing close team supervision and 24 hour management of active medical problems listed below. Physiatrist and rehab team continue to assess barriers to discharge/monitor patient progress toward functional and medical goals. FIM: FIM - Bathing Bathing Steps Patient Completed: Chest;Right Arm;Left Arm;Abdomen;Front perineal area;Buttocks;Right upper leg;Left upper leg;Right lower leg (including foot);Left lower leg (including foot) Bathing: 5: Set-up assist to: Obtain items  FIM - Upper Body Dressing/Undressing Upper body dressing/undressing steps patient completed: Thread/unthread right sleeve of pullover shirt/dresss;Thread/unthread left sleeve of pullover shirt/dress;Put head through opening of pull over shirt/dress;Pull shirt over trunk Upper body dressing/undressing: 5: Supervision: Safety issues/verbal cues FIM - Lower Body Dressing/Undressing Lower body dressing/undressing steps patient completed: Thread/unthread right underwear leg;Thread/unthread left underwear leg;Pull underwear up/down;Thread/unthread right pants leg;Thread/unthread left pants leg;Pull pants up/down;Don/Doff right shoe;Don/Doff left shoe;Fasten/unfasten right shoe;Fasten/unfasten left  shoe Lower body dressing/undressing: 4: Min-Patient completed 75 plus % of tasks  FIM - Toileting Toileting steps completed by patient: Performs perineal hygiene Toileting Assistive Devices: Grab bar or rail for support Toileting: 2: Max-Patient completed 1 of 3 steps  FIM - Diplomatic Services operational officer Devices: Grab bars Toilet Transfers: 4-To toilet/BSC: Min A (steadying Pt. > 75%);4-From toilet/BSC: Min A (steadying Pt. > 75%)  FIM - Bed/Chair Transfer Bed/Chair  Transfer Assistive Devices: Arm rests Bed/Chair Transfer: 3: Bed > Chair or W/C: Mod A (lift or lower assist);4: Chair or W/C > Bed: Min A (steadying Pt. > 75%)  FIM - Locomotion: Wheelchair Locomotion: Wheelchair: 1: Total Assistance/staff pushes wheelchair (Pt<25%) FIM - Locomotion: Ambulation Locomotion: Ambulation Assistive Devices: Walker - Rolling;Orthosis Ambulation/Gait Assistance: 4: Min assist Locomotion: Ambulation: 1: Travels less than 50 ft with minimal assistance (Pt.>75%)  Comprehension Comprehension Mode: Auditory Comprehension: 6-Follows complex conversation/direction: With extra time/assistive device  Expression Expression Mode: Verbal Expression: 6-Expresses complex ideas: With extra time/assistive device  Social Interaction Social Interaction: 6-Interacts appropriately with others with medication or extra time (anti-anxiety, antidepressant).  Problem Solving Problem Solving: 6-Solves complex problems: With extra time  Memory Memory: 6-More than reasonable amt of time  Medical Problem List and Plan:  1. Right frontal parietal CVA after ERCP , spasticity controlled with zanaflex 2. DVT Prophylaxis/Anticoagulation: SCDs. Monitor for any signs of DVT.  3. Acute non-ST segment elevation myocardial infarction. Cardiac cath unremarkable. No complaints of chest pain or shortness of breath. Continue medical management with aspirin and Xarelto, trying to get pt assistance  4. Obstructive jaundice/history ileostomy. Monitor LFTs and routine care of ileostomy. Continue Protonix  -pt,wife had been independent with ostomy care PTA  -serial labs  5. Hypertension. Lisinopril 10 mg daily and Lopressor 25 mg twice a day. Monitor with increased mobility hild lisinopril today for BP 100 systolic 6. PAF. Xarelto 20 mg daily. Cardiac rate controlled , monitor renal fx 6. Hyperlipidemia. Crestor  7. Dysphagia. Currently on dysphagia 3 nectar liquids. We'll have speech therapy  followup  8. Tone: ROM, splinting, tizanidine improved clonus but developing L ankle contracture needs PRAFO 9.  Leukocytosis resolved           10. Insomnia improved on elavil  LOS (Days) 19 A FACE TO FACE EVALUATION WAS PERFORMED  KIRSTEINS,ANDREW E 11/08/2011, 8:20 AM

## 2011-11-08 NOTE — Progress Notes (Signed)
Physical Therapy Note  Patient Details  Name: Shawn Wiley MRN: [id number] Date of Birth: Mar 09, 1936 Today's Date: 11/08/2011  10:30-13:00 outting to lunch. Pt denied pain.  Pt performed wc mobility in community supervision with vc for safe use on uneven ground and to lean forward to get wc up incline. Pt was able to maneuver around restaurant without cues. Gait training with rw min assist including dynamic balance to push tray down line and to reach for food. vc and assist needed to keep left hand on the walker. pt was able to use left hand to push tray 20% of the time and to grab silver wear. Family education was performed on problem solving for community, bathroom safety, and energy conservation.   Julian Reil 11/08/2011, 1:13 PM

## 2011-11-08 NOTE — Progress Notes (Signed)
Recreational Therapy Session Note  Patient Details  Name: Shawn Wiley MRN: [id number] Date of Birth: Dec 29, 1935 Today's Date: 11/08/2011 Time:  1030-1300 Pain: no c/o Skilled Therapeutic Interventions/Progress Updates: Pt participated in community reintegration/outing to [employer] with focus on w/c mobility, ambulation, dynamic standing balance, LUE use, energy conservation, and accessing public restroom.  Pt's wife present and participatory throughout  Therapy/Group: Community Reintegration  Activity Level: Moderate:  Level of assist: Supervision- w/c level  Geovanie Winnett 11/08/2011, 5:13 PM

## 2011-11-09 DIAGNOSIS — G8110 Spastic hemiplegia affecting unspecified side: Secondary | ICD-10-CM

## 2011-11-09 DIAGNOSIS — Z5189 Encounter for other specified aftercare: Secondary | ICD-10-CM

## 2011-11-09 DIAGNOSIS — I6330 Cerebral infarction due to thrombosis of unspecified cerebral artery: Secondary | ICD-10-CM

## 2011-11-09 NOTE — Discharge Summary (Signed)
  Discharge summary job # [id number]

## 2011-11-09 NOTE — Progress Notes (Signed)
Physical Therapy Session Note  Patient Details  Name: Shawn Wiley MRN: [id number] Date of Birth: 1935/07/26  Today's Date: 11/09/2011 Time: 0930-1030 and  1345-1500 Time Calculation (min): 60 and 75 min  Short Term Goals: = LTGs     Skilled Therapeutic Interventions/Progress Updates:   AM-   focus on family ed, gait and transfer training; wife trained in gait, bed mobility and transfer training, PROM L ankle; DME  R hand orthosis adjusted due to pt's c/o L 5th finger rubbing edge of orthosis; better after change of Velcro attachment.    Instructed pt and wife in PROM L ankle due to hypertonus L ankle PF, using strap.    Gait training with RW with L hand orthosis, x 50' min assist.  Gait training sideways, backwards, turning L and R.  Therapist simulated pt losing balance to L, requiring wife to assist with balance to bring pt's weight over feet.    Simulated car transfer using RW, with min assist; x 2.  VCs for safest hand placement R.  W/c>< mat transfer with mat raised to 28" (pt' s bed is 29-30" high) with close supervision.  Discussed sit> supine safety on high bed, regarding non-slippery clothes, etc.  Rolling L with supervison without cues; rolling R with supervision with cues, R sidelying> sit with close supervision, without cues.    W/c propulsion using hemi technique with RUE, RLE x 157' with supervision.  PM-   further family ed with wife  LLE edema reduced; L AFO slightly large now, but functional with addition of sock over TEDs.  Bil new shoes a little big; benefits from socks bil feet.  Gait training x 68' with supervision, focusing on wider stance, upright trunk and forward gaze.  Up/down 5 steps with 2 rails, step-to pattern, min guarding, x 2.    Neuromuscular re-education via forced use, visual feedback, VCs for LLE knee flexion, in sitting for L foot position before standing, and to propel w/c using bil LEs instead of RUE and RLE.  LUE used to lock/unlock L  brake of w/c with VCs, x 3.    DME delivered; adjusted RW and hemi-ht w/c to fit pt.  W/c mobility in room with supervision.        Therapy Documentation Precautions:  Precautions Precautions: Fall Precaution Comments: abnormal LUE & LLE tone Restrictions Weight Bearing Restrictions: No    Pain Assessment Pain Assessment: 0-10 Pain Score:   8 (pain due to OA,  bil shoulders, neck, thumbs) Pain Type: Chronic pain Pain Location: Hand Pain Orientation: Right;Left Pain Descriptors: Aching;Sore Pain Frequency: Occasional Pain Onset: Gradual Patients Stated Pain Goal: 3 Pain Intervention(s): Medication (See eMAR) Mobility: Bed Mobility Bed Mobility: Rolling Right;Rolling Left;Right Sidelying to Sit Rolling Right: 5: Supervision Rolling Right Details: Verbal cues for technique Rolling Left: 5: Supervision Rolling Left Details: Verbal cues for technique Right Sidelying to Sit: 5: Supervision Right Sidelying to Sit Details: Verbal cues for technique Locomotion : Ambulation Ambulation: Yes Ambulation/Gait Assistance: 4: Min guard Ambulation Distance (Feet): 90 Feet Assistive device: Rolling walker Ambulation/Gait Assistance Details: Verbal cues for technique Gait Gait: Yes (L custom AFO) Gait Pattern: Impaired Gait Pattern: Narrow base of support;Trunk flexed;Trunk rotated posteriorly on left;Decreased dorsiflexion - left High Level Ambulation High Level Ambulation: Backwards walking;Direction changes (L custom AFO) Backwards Walking: min assist Direction Changes: min assist, VCs when turning L to keep BOS wider Stairs / Additional Locomotion Stairs: Yes Stairs Assistance: 4: Min guard Stairs Assistance Details: Verbal cues for  technique Stair Management Technique: Two rails;Step to pattern Number of Stairs: 5  Height of Stairs: 8  Wheelchair Mobility Wheelchair Mobility: Yes Wheelchair Assistance: 5: Supervision Wheelchair Assistance Details: Visual cues/gestures  for precautions/safety (L inattention, including LUE) Wheelchair Propulsion: Right lower extremity;Right upper extremity Wheelchair Parts Management: Supervision/cueing Distance: 157  Trunk/Postural Assessment : Cervical Assessment Cervical Assessment: Within Functional Limits Thoracic Assessment Thoracic Assessment: Exceptions to [facility] (L trunk weakness) Lumbar Assessment Lumbar Assessment: Exceptions to WFL (L trunk weakness; shortened on L) Postural Control Postural Control: Deficits on evaluation (delayed righting and protective reactions)  Balance: Balance Balance Assessed: Yes (dynamic sitting with supervision; dynamic standing min assis) Exercises:   Other Treatments:    See FIM for current functional status  Therapy/Group: Individual Therapy  Carlisia Geno 11/09/2011, 4:02 PM

## 2011-11-09 NOTE — Progress Notes (Signed)
Social Work Patient ID: Shawn Wiley, male   DOB: [birth year], 76 y.o.   MRN: [id number] Met with pt and wife to discuss discharge needs. Pt requires a light weight wheelchair to assist with mobility, he is unable to self propel in a standard wheelchair. He also does his ADL's with assistance of the wheelchair.  Finishing family education today in anticipation of discharge tomorrow.

## 2011-11-09 NOTE — Patient Care Conference (Signed)
Inpatient RehabilitationTeam Conference Note Date: 11/08/2011   Time: 10:30 AM    Patient Name: Shawn Wiley      Medical Record Number: [id number]  Date of Birth: 1935-08-06 Sex: Male         Room/Bed: 4031/4031-01 Payor Info: Payor: MEDICARE  Plan: MEDICARE PART A AND B  Product Type: *No Product type*     Admitting Diagnosis: CVA/MI  Admit Date/Time:  10/20/2011 12:23 PM Admission Comments: No comment available   Primary Diagnosis:  CVA (cerebral infarction) Principal Problem: CVA (cerebral infarction)  Patient Active Problem List   Diagnosis Date Noted  . CVA (cerebral infarction) 10/20/2011    Expected Discharge Date: Expected Discharge Date: 11/10/11  Team Members Present: Physician: Dr. Claudette Laws Case Manager Present: Lutricia Horsfall, RN Social Worker Present: Dossie Der, LCSW Nurse Present: Laural Roes, RN PT Present: Edman Circle, PT;Becky Henrene Dodge, PT OT Present: Leonette Monarch, OT SLP Present: Fae Pippin, SLP     Current Status/Progress Goal Weekly Team Focus  Medical   urinary incontinence intermittent, sleep improved, increasing spasticity on the left side  improve urinary incontinence, manage spasticity  a just spasticity medications, toileting program   Bowel/Bladder   continent of bladder. patient went to the bathroom to void at night. colostomy intact. emptied bag before bedtime  continent of bladder, manage colostomy with assist of wife  continent of bladder. able to assist wife during colostomy care.   Swallow/Nutrition/ Hydration             ADL's   Supervision: grooming/UB dressing/bathing; Min A: LB dressing and functional transfers.  Supervision for eating/grooming/UB dressing; Min A for bathing/LB dressing/functional transfers.  A/ROM LUE; family education; standing balance   Mobility   min A bed mobility, basic transfers, gait short distances and min-mod A stairs  min assist overall  family education, D/C planning    Communication             Safety/Cognition/ Behavioral Observations            Pain   complaints of occassional pain in hands, arthritis per pt report, relieved by 650 mg tylenol  complaint of arthritic pain to both hands 5/10. tylenol given 2 tablets with relief.  <2 pain scale   Skin                *See Interdisciplinary Assessment and Plan and progress notes for long and short-term goals  Barriers to Discharge: caregiver not trained in all aspects    Possible Resolutions to Barriers:  additional caregiver training    Discharge Planning/Teaching Needs:  Home with wife and other family members to assist-family education this week. Start off with [place] and then transition to OP      Team Discussion:  SPasticity and tone has improved. AFO effective. Noted recovery to UE this week. Wife has been very involved with pt's care. Ready for d/c Fri.  Revisions to Treatment Plan:  SLP has d/c'd pt.    Continued Need for Acute Rehabilitation Level of Care: The patient requires daily medical management by a physician with specialized training in physical medicine and rehabilitation for the following conditions: Daily direction of a multidisciplinary physical rehabilitation program to ensure safe treatment while eliciting the highest outcome that is of practical value to the patient.: Yes Daily medical management of patient stability for increased activity during participation in an intensive rehabilitation regime.: Yes Daily analysis of laboratory values and/or radiology reports with any subsequent need for medication adjustment of  medical intervention for : Neurological problems  Meryl Dare 11/09/2011, 9:36 AM

## 2011-11-09 NOTE — Progress Notes (Signed)
Patient ID: Shawn Wiley, male   DOB: Dec 03, 1935, 76 y.o.   MRN: [id number] Patient ID: Shawn Wiley, male   DOB: 1935-06-11, 76 y.o.   MRN: [id number]  Subjective/Complaints: No spasms no pain Review of Systems  Respiratory: Positive for cough and sputum production. Negative for hemoptysis, shortness of breath and wheezing.   All other systems reviewed and are negative.   Objective: Vital Signs: Blood pressure 115/71, pulse 77, temperature 98.2 F (36.8 C), temperature source Oral, resp. rate 19, height 5\' 10"  (1.778 m), weight 76 kg (167 lb 8.8 oz), SpO2 95.00%. No results found. Results for orders placed during the hospital encounter of 10/20/11 (from the past 72 hour(s))  CBC WITH DIFFERENTIAL     Status: Abnormal   Collection Time   11/06/11  9:27 AM      Component Value Range Comment   WBC 8.6  4.0 - 10.5 K/uL WHITE COUNT CONFIRMED ON SMEAR   RBC 4.38  4.22 - 5.81 MIL/uL    Hemoglobin 13.5  13.0 - 17.0 g/dL    HCT 78.2 (*) 95.6 - 52.0 %    MCV 87.7  78.0 - 100.0 fL    MCH 30.8  26.0 - 34.0 pg    MCHC 35.2  30.0 - 36.0 g/dL    RDW 21.3  08.6 - 57.8 %    Platelets 295  150 - 400 K/uL    Neutrophils Relative 66  43 - 77 %    Lymphocytes Relative 17  12 - 46 %    Monocytes Relative 13 (*) 3 - 12 %    Eosinophils Relative 3  0 - 5 %    Basophils Relative 1  0 - 1 %    Neutro Abs 5.6  1.7 - 7.7 K/uL    Lymphs Abs 1.5  0.7 - 4.0 K/uL    Monocytes Absolute 1.1 (*) 0.1 - 1.0 K/uL    Eosinophils Absolute 0.3  0.0 - 0.7 K/uL    Basophils Absolute 0.1  0.0 - 0.1 K/uL    Smear Review MORPHOLOGY UNREMARKABLE        HEENT: normal Cardio: RRR Resp: CTA B/L GI: BS positive and RLQ colostomy Extremity:  No Edema Skin:   Intact Neuro: Alert/Oriented, Abnormal Sensory tingling with Lt touch LUE and LLE and Abnormal Motor 3-/5 with flexor synergy LUE, 2-/5 with ext synergy LLE except 0/5 L ankle Musc/Skel:  Normal Tone ashworth 3 at triceps, 2 in biceps and finger/wrist  flexors Unable to oppose finger to thumb New 3-/5 L Wrist extensor strength, 3+/5 biceps and triceps Assessment/Plan: 1. Functional deficits secondary to R frontal and pariental infarct as well as decondtioning which require 3+ hours per day of interdisciplinary therapy in a comprehensive inpatient rehab setting. Physiatrist is providing close team supervision and 24 hour management of active medical problems listed below. Physiatrist and rehab team continue to assess barriers to discharge/monitor patient progress toward functional and medical goals.  DC Home 7/12 FIM: FIM - Bathing Bathing Steps Patient Completed: Chest;Right Arm;Left Arm;Abdomen;Front perineal area;Buttocks;Right upper leg;Left upper leg;Right lower leg (including foot);Left lower leg (including foot) Bathing: 5: Supervision: Safety issues/verbal cues  FIM - Upper Body Dressing/Undressing Upper body dressing/undressing steps patient completed: Thread/unthread right sleeve of pullover shirt/dresss;Thread/unthread left sleeve of pullover shirt/dress;Put head through opening of pull over shirt/dress;Pull shirt over trunk Upper body dressing/undressing: 5: Supervision: Safety issues/verbal cues FIM - Lower Body Dressing/Undressing Lower body dressing/undressing steps patient completed: Thread/unthread right underwear leg;Thread/unthread left  underwear leg;Pull underwear up/down;Thread/unthread right pants leg;Thread/unthread left pants leg;Pull pants up/down;Don/Doff right shoe;Fasten/unfasten right shoe;Fasten/unfasten left shoe Lower body dressing/undressing: 4: Min-Patient completed 75 plus % of tasks  FIM - Toileting Toileting steps completed by patient: Performs perineal hygiene Toileting Assistive Devices: Grab bar or rail for support Toileting: 2: Max-Patient completed 1 of 3 steps  FIM - Diplomatic Services operational officer Devices: Grab bars Toilet Transfers: 4-To toilet/BSC: Min A (steadying Pt. >  75%);4-From toilet/BSC: Min A (steadying Pt. > 75%)  FIM - Bed/Chair Transfer Bed/Chair Transfer Assistive Devices: Arm rests Bed/Chair Transfer: 4: Sit > Supine: Min A (steadying pt. > 75%/lift 1 leg);4: Supine > Sit: Min A (steadying Pt. > 75%/lift 1 leg);4: Bed > Chair or W/C: Min A (steadying Pt. > 75%);4: Chair or W/C > Bed: Min A (steadying Pt. > 75%)  FIM - Locomotion: Wheelchair Locomotion: Wheelchair: 1: Total Assistance/staff pushes wheelchair (Pt<25%) FIM - Locomotion: Ambulation Locomotion: Ambulation Assistive Devices: Walker - Rolling;Orthosis Ambulation/Gait Assistance: 4: Min assist Locomotion: Ambulation: 1: Travels less than 50 ft with minimal assistance (Pt.>75%)  Comprehension Comprehension Mode: Auditory Comprehension: 6-Follows complex conversation/direction: With extra time/assistive device  Expression Expression Mode: Verbal Expression: 6-Expresses complex ideas: With extra time/assistive device  Social Interaction Social Interaction: 6-Interacts appropriately with others with medication or extra time (anti-anxiety, antidepressant).  Problem Solving Problem Solving: 6-Solves complex problems: With extra time  Memory Memory: 6-More than reasonable amt of time  Medical Problem List and Plan:  1. Right frontal parietal CVA after ERCP , spasticity controlled with zanaflex 2. DVT Prophylaxis/Anticoagulation: SCDs. Monitor for any signs of DVT.  3. Acute non-ST segment elevation myocardial infarction. Cardiac cath unremarkable. No complaints of chest pain or shortness of breath. Continue medical management with aspirin and Xarelto, trying to get pt assistance  4. Obstructive jaundice/history ileostomy. Monitor LFTs and routine care of ileostomy. Continue Protonix  -pt,wife had been independent with ostomy care PTA  -serial labs  5. Hypertension. Lisinopril 10 mg daily and Lopressor 25 mg twice a day. Monitor with increased mobility hild lisinopril today for BP  100 systolic 6. PAF. Xarelto 20 mg daily. Cardiac rate controlled , monitor renal fx 6. Hyperlipidemia. Crestor  7. Dysphagia. Currently on dysphagia 3 nectar liquids. We'll have speech therapy followup  8. Tone: ROM, splinting, tizanidine improved clonus but developing L ankle contracture needs PRAFO 9.  Leukocytosis resolved           10. Insomnia improved on elavil  LOS (Days) 20 A FACE TO FACE EVALUATION WAS PERFORMED  Tomekia Helton T 11/09/2011, 7:06 AM

## 2011-11-09 NOTE — Discharge Summary (Signed)
NAMEMarland Wiley  LEANTHONY, [name]:  [id number]  MEDICAL RECORD NO.:  [id number]  LOCATION:  4031                         FACILITY:  [facility]  PHYSICIAN:  [name], M.D.DATE OF BIRTH:  September 30, 1935  DATE OF ADMISSION:  10/20/2011 DATE OF DISCHARGE:                              DISCHARGE SUMMARY   DISCHARGE DATE:  November 10, 2011  DISCHARGE DIAGNOSES: 1. Right frontoparietal cerebrovascular accident. 2. Sequential compression devices for deep venous thrombosis     prophylaxis. 3. Acute non-ST-segment elevation myocardial infarction. 4. Obstructive jaundice with history of ileostomy. 5. Hypertension. 6. Paroxysmal atrial fibrillation. 7. Hyperlipidemia. 8. Dysphagia.  This is a 76 year old right-handed male with previous subtotal colectomy, ileostomy, and cholecystectomy, admitted to [facility] October 09, 2011, with abdominal pain.  The patient states that on October 06, 2011, had an abdominal discomfort in the right upper quadrant as well as left upper quadrant.  He stated that ileostomy was working regularly. The patient became jaundice as well as mild elevation in liver function studies.  Noted bilirubin of 4.1.  The patient plans for ERCP per [name].  During the procedure, code blue was called as the patient developed acute onset of respiratory distress and hypoxia with bradycardia.  Initial heart rates were in the 40s and oxygen saturations in the 70s.  EKG showed ST-segment elevation myocardial infarction, he was transferred to intensive care unit.  Noted by ERCP, felt to be obstructive jaundice.  He also had undergone endoscopic retrograde cholangiopancreatography that was unremarkable.  Cardiology was consulted.  Echocardiogram completed that showed mild concentric left ventricular hypertrophy with an ejection fraction of 65%.  The patient with noted left-sided weakness and slurred speech after ERCP.  MRI of the brain June 13th showed  restricted diffusion in the right frontal and parietal cortex compatible with acute or subacute infarction.  No hemorrhage or mass noted.  CT of the chest showed no pulmonary embolism. Carotid Doppler showed calcified plaque bilaterally in the carotid bulbs, around 50-69% stenosis suspected bilaterally.  The patient was placed on aspirin therapy as well as subcutaneous Lovenox for DVT prophylaxis.  Pertaining to his obstructive jaundice, he had been placed on Zosyn, changed to Ceftin which was felt to be for his cholecystitis- type problems.  The patient remained afebrile.  He is transferred to [facility] October 18, 2011, for heart catheterization after acute non-ST-segment elevation myocardial infarction with cardiac catheterization unremarkable, advised medical regimen of continue aspirin, but his subcutaneous Lovenox was discontinued at that time and placed on Xarelto for paroxysmal atrial flutter.  The patient was admitted for comprehensive rehab program.  PAST MEDICAL HISTORY:  See discharge diagnoses.  SOCIAL HISTORY:  Married.  Retired.  He does volunteer work.  He lives in a 1-level home, 4 steps to entry.  He denies any tobacco or alcohol.  Functional history prior to admission was independent.  Functional status upon admission to rehab services was max assist for bed mobility, moderate to max assist transfers.  PHYSICAL EXAMINATION:  VITAL SIGNS:  Blood pressure 118/70, pulse 74, respirations 18, temperature 99. GENERAL:  This was an alert male, well developed.  He names person, place, situation.  LUNGS:  Clear to auscultation. CARDIAC:  Rate controlled. ABDOMEN:  Soft, nontender.  Good bowel sounds.  Speech was dysarthric, but intelligible.  He followed simple commands.  REHABILITATION HOSPITAL COURSE:  The patient was admitted to inpatient rehab services with therapies initiated on a 3-hour daily basis consisting of physical therapy, occupational  therapy, speech therapy, and rehabilitation nursing.  The following issues were addressed during the patient's rehabilitation stay.  Pertaining to Mr. Shawn Wiley's right frontoparietal CVA, he remained stable, maintained on aspirin therapy. The patient with sequential compression devices throughout his rehab stay for DVT prophylaxis.  He had no chest pain or shortness of breath throughout his rehab course.  He was maintained on Xarelto for proximal atrial flutter established per Cardiology Services while at [facility] after heart catheterization.  He is followed by [name] at [facility] for obstructive jaundice.  Noted history of ileostomy.  Liver function studies were monitored during his rehab stay.  He continued on Crestor for hyperlipidemia.  His diet was slowly advanced to a mechanical soft, thin liquid diet during his rehab stay with no signs of aspiration.  He did have some increased tone after stroke, placed on low-dose Zanaflex 2 mg 3 times daily and monitored. His blood pressures were controlled on lisinopril and metoprolol with no orthostatic changes.  The patient received weekly collaborative interdisciplinary team conferences to discuss estimated length of stay, family teaching, and any barriers to discharge.  He was continent of bladder.  The patient did have ileostomy.  He was supervision grooming, minimal assist for bathing, upper body dressing, lower body dressing. Moderate assist for shower transfers and toileting.  Minimal assist supervision, bed mobility.  Moderate assist transfers.  Mod to max assist to ambulate 52 feet, supervision and minimal assist, and able to propel his wheelchair.  He was able to communicate his needs without difficulty.  Full family teaching was completed with his family and plans to be discharged to home on November 10, 2011.  DISCHARGE MEDICATIONS: 1. Elavil 25 mg p.o. at bedtime. 2. Aspirin 81 mg p.o.  daily. 3. Lipitor 20 mg p.o. daily. 4. Vitamin B complex 1 tablet daily. 5. Lisinopril 5 mg p.o. daily. 6. Metoprolol 12.5 mg p.o. b.i.d. 7. Multivitamin daily. 8. Protonix 40 mg daily. 9. Xarelto 20 mg p.o. daily. 10.Zanaflex 2 mg p.o. t.i.d.  DIET:  Mechanical soft.  SPECIAL INSTRUCTIONS:  The patient will follow up with Dr. Claudette Laws at the outpatient rehab center December 18, 2011.  Arrived at 9:45 a.m.  Follow up Dr. Foye Deer, medical management in 2 weeks, call for appointment.  [name] at [facility] in 2 weeks.     Mariam Dollar, P.A.   ______________________________ [name], M.D.    DA/MEDQ  D:  11/09/2011  T:  11/09/2011  Job:  [id number]  cc:   Foye Deer, MD

## 2011-11-09 NOTE — Progress Notes (Signed)
Physical Therapy Discharge Summary  Patient Details  Name: Shawn Wiley MRN: [id number] Date of Birth: 1935-06-26  Today's Date: 11/09/2011 Time: 1345-1500 Time Calculation (min): 75 min  Patient has met 8 of 8 long term goals due to improved activity tolerance, improved balance, improved postural control, increased strength, increased range of motion, ability to compensate for deficits, functional use of  left upper extremity and left lower extremity, improved attention and improved awareness.  Patient to discharge at an ambulatory level Min Assist; w/c level, supervision assist.  Patient's care partner is independent to provide the necessary physical assistance at discharge.  Reasons goals not met: n/a  Recommendation:  Patient will benefit from ongoing skilled PT services in home health setting , transitioning to OPPT, to continue to advance safe functional mobility, address ongoing impairments in muscle timing and sequencing, strength, activity tolerance, balance, awareness, impulsivity, and minimize fall risk.  Equipment: RW, L hand orthosis; L custom AFO; 16x 16 Breezy w/c, with Vonna Kotyk Basic back and cushion  Reasons for discharge: treatment goals met and discharge from hospital  Patient/family agrees with progress made and goals achieved: Yes  PT Discharge Precautions/Restrictions Precautions Precautions: Fall Restrictions Weight Bearing Restrictions: No  Pain Pain Assessment Pain Assessment: 0-10 Pain Score:   8 (pain due to OA,  bil shoulders, neck, thumbs) Pain Type: Chronic pain Pain Location: Hand Pain Orientation: Right;Left Pain Descriptors: Aching;Sore Pain Frequency: Occasional Pain Onset: Gradual Patients Stated Pain Goal: 3 Pain Intervention(s): Medication (See eMAR)    Cognition Overall Cognitive Status: Appears within functional limits for tasks assessed Orientation Level: Oriented X4 Sensation Sensation Light Touch: Not tested Proprioception: Not  tested Motor  Motor Motor: Hemiplegia Motor - Discharge Observations: hypertonus LLE, but reduced from admission  Mobility Bed Mobility Bed Mobility: Rolling Right;Rolling Left;Right Sidelying to Sit Rolling Right: 5: Supervision Rolling Right Details: Verbal cues for technique Rolling Left: 5: Supervision Rolling Left Details: Verbal cues for technique Right Sidelying to Sit: 5: Supervision Right Sidelying to Sit Details: Verbal cues for technique Locomotion  Ambulation Ambulation: Yes Ambulation/Gait Assistance: 4: Min guard Ambulation Distance (Feet): 90 Feet Assistive device: Rolling walker Ambulation/Gait Assistance Details: Verbal cues for technique Gait Gait: Yes (L custom AFO) Gait Pattern: Impaired Gait Pattern: Narrow base of support;Trunk flexed;Trunk rotated posteriorly on left;Decreased dorsiflexion - left High Level Ambulation High Level Ambulation: Backwards walking;Direction changes (L custom AFO) Backwards Walking: min assist Direction Changes: min assist, VCs when turning L to keep BOS wider Stairs / Additional Locomotion Stairs: Yes Stairs Assistance: 4: Min guard Stairs Assistance Details: Verbal cues for technique Stair Management Technique: Two rails;Step to pattern Number of Stairs: 5  Height of Stairs: 8  Wheelchair Mobility Wheelchair Mobility: Yes Wheelchair Assistance: 5: Financial planner Details: Visual cues/gestures for precautions/safety (L inattention, including LUE) Wheelchair Propulsion: Right lower extremity;Right upper extremity Wheelchair Parts Management: Supervision/cueing Distance: 157  Trunk/Postural Assessment  Cervical Assessment Cervical Assessment: Within Functional Limits Thoracic Assessment Thoracic Assessment: Exceptions to [facility] (L trunk weakness) Lumbar Assessment Lumbar Assessment: Exceptions to [facility] (L trunk weakness; shortened on L) Postural Control Postural Control: Deficits on evaluation (delayed  righting and protective reactions)  Balance Balance Balance Assessed: Yes (dynamic sitting with supervision; dynamic standing min assis) Extremity Assessment      RLE Assessment RLE Assessment: Within Functional Limits LLE Assessment LLE Assessment: Exceptions to [facility] (hip grossly 4-/5; knee extension 4/5; knee flexion 2-/5; ankle PF 2-/5; no active ankle DF) LLE Tone LLE Tone Comments: hypertonus LLE  See  FIM for current functional status  Shawn Wiley 11/09/2011, 3:55 PM

## 2011-11-09 NOTE — Progress Notes (Signed)
Recreational Therapy Discharge Summary Patient Details  Name: OTTIS VACHA MRN: [id number] Date of Birth: 17-Jun-1935 Today's Date: 11/09/2011  Long term goals set: 1  Long term goals met: 1  Comments on progress toward goals: Pt participated in community reintegration during ELOS at superviion w/c level.  Pt's wife present and participatory in session and both educated on safe mobility, accessing public restrooms, negotiating obstacles, and energy conservation.  Pt is ready for discharge home with wife.  Reasons for discharge: discharge from hospital  Patient/family agrees with progress made and goals achieved: Yes  Katalia Choma 11/09/2011, 9:50 AM

## 2011-11-09 NOTE — Progress Notes (Signed)
Per State Regulation 482.30 This chart was reviewed for medical necessity with respect to the patient's Admission/Duration of stay. Family ed in progress in preparation for d/c tomorrow. Met with pt and wife after team conf yesterday, both continue to be in agreement with plan for d/c Fri. Meryl Dare                 Nurse Care Manager

## 2011-11-09 NOTE — Progress Notes (Addendum)
Occupational Therapy Discharge Summary and Treatment Session  Patient Details  Name: Shawn Wiley MRN: [id number] Date of Birth: [birth year]  Today's Date: 11/09/2011 Time: 830-930 (60 mins)  Patient has met 8 of 8 long term goals due to improved activity tolerance, improved balance, ability to compensate for deficits and functional use of  LEFT upper extremity.  Patient to discharge at overall Mod I to [facility] Assist level.  Patient's care partner has been educated to provide the necessary physical assistance at discharge.     Recommendation:  Patient will benefit from ongoing skilled OT services in home health setting then transition to outpatient to continue to advance functional skills in the area of BADL.  Equipment: None recommended by OT  Reasons for discharge: Patient met maximum potential for rehab.  Patient/family agrees with progress made and goals achieved: Yes  OT Discharge Precautions/Restrictions  Restrictions Weight Bearing Restrictions: No Pain Pain Assessment Pain Assessment: No/denies pain Pain Score: 0-No pain ADL ADL Equipment Provided: Long-handled shoe horn;Long-handled sponge Eating: Set up;Minimal cueing Where Assessed-Eating: Wheelchair Grooming: Modified independent Where Assessed-Grooming: Sitting at sink Upper Body Bathing: Supervision/safety Where Assessed-Upper Body Bathing: Shower Lower Body Bathing: Supervision/safety Where Assessed-Lower Body Bathing: Shower Upper Body Dressing: Supervision/safety Where Assessed-Upper Body Dressing: Sitting at sink Lower Body Dressing: Contact guard Where Assessed-Lower Body Dressing: Standing at sink;Sitting at sink Toileting: Minimal assistance Where Assessed-Toileting: Teacher, adult education: Curator Method: Surveyor, minerals: Acupuncturist: Insurance underwriter Method: Warden/ranger:  Transfer tub bench;Grab bars Vision/Perception  Vision - History Baseline Vision: No visual deficits Patient Visual Report: No change from baseline Vision - Assessment Eye Alignment: Within Functional Limits  Cognition Overall Cognitive Status: Appears within functional limits for tasks assessed Arousal/Alertness: Awake/alert Orientation Level: Oriented X4 Focused Attention: Appears intact Sustained Attention: Appears intact Alternating Attention: Appears intact Memory: Appears intact Awareness: Appears intact Problem Solving: Appears intact Sensation Sensation Light Touch: Appears Intact Hot/Cold: Appears Intact Motor  Motor Motor: Hemiplegia Motor - Skilled Clinical Observations: left hemiplegia Mobility  Bed Mobility Bed Mobility: Not assessed  Balance Balance Balance Assessed: No Extremity/Trunk Assessment RUE Assessment RUE Assessment: Within Functional Limits LUE Strength LUE Overall Strength: Deficits (3-/5)  See FIM for current functional status  ADL re-training performed this AM. Patient performed bathing and shower transfer at contact guard assist without the need for vc's for technique or safety. No vc's required to complete hemi-dressing. Patient used long handled sponge during bathing. Patient is ready for discharge and has met all LTGs. Wife present during tx session. -A/ROM LUE; shoulder flexion/extension, horizontal shoulder adduction/abduction, shoulder adduction/abduction with verbal cues and physical cues to shoulder to prevent substitution patterns. 1 set; 10 reps. -Patient reached for several rings with LUE requiring shoulder to perform above movements to inhibit proper shoulder movement patterns. Physical assist to perform neutral hand position to grasp ring. -P/ROM and A/ROM LUE on table top; wrist flexion/extension; supination pronation; 1 set; 5 reps.     Kissy Cielo, Charisse March, OTR/L 11/09/2011, 11:39 AM

## 2011-11-09 NOTE — Progress Notes (Signed)
Social Work Patient ID: Shawn Wiley, male   DOB: [birth year], 76 y.o.   MRN: [id number] Met with pt and wife to discuss discharge tomorrow.  DME delivered and taken to pt's car. Home health arranged And will see pt Sat via [facility]-pt's pref.  Family education completed and both ready for discharge tomorrow.

## 2011-11-09 NOTE — Progress Notes (Signed)
Occupational Therapy Session Note  Patient Details  Name: WALLACE GAPPA MRN: [id number] Date of Birth: [birth year]  Today's Date: 11/09/2011 Time: 1100-1130 Time Calculation (min): 30 min  Short Term Goals: Week 2:  OT Short Term Goal 1 (Week 2): Patient will complete transfers functional transfers with min assist  OT Short Term Goal 2 (Week 2): Patient will complete upper body bathing with supervision and setup  OT Short Term Goal 3 (Week 2): Patient will increase A/ROM of L UE to increase functional performance during ADL tasks.  Skilled Therapeutic Interventions/Progress Updates:    1:1 neuromuscular reeducation: PNF patterns with left UE in supine with light guidance for smoothness of movement, shoulder flexion against gravity, holding inflatable ball with both hands elevating to ceiling with emphasis on symmetrical movements, transitional trunk mobiliy  Therapy Documentation Precautions:  Precautions Precautions: Fall Precaution Comments: abnormal LUE & LLE tone Restrictions Weight Bearing Restrictions: No Pain: No c./o pain See FIM for current functional status  Therapy/Group: Individual Therapy  Roney Mans [facility] 11/09/2011, 3:11 PM

## 2011-11-10 MED ORDER — METOPROLOL TARTRATE 12.5 MG HALF TABLET: 12.5000 mg | ORAL_TABLET | Freq: Two times a day (BID) | ORAL | Status: DC

## 2011-11-10 MED ORDER — AMITRIPTYLINE HCL 25 MG PO TABS: 25.0000 mg | ORAL_TABLET | Freq: Every day | ORAL | Status: DC

## 2011-11-10 MED ORDER — TIZANIDINE HCL 2 MG PO TABS: 2.0000 mg | ORAL_TABLET | Freq: Three times a day (TID) | ORAL | Status: AC

## 2011-11-10 MED ORDER — PANTOPRAZOLE SODIUM 40 MG PO TBEC: 40.0000 mg | DELAYED_RELEASE_TABLET | Freq: Every day | ORAL | Status: DC

## 2011-11-10 MED ORDER — ATORVASTATIN CALCIUM 20 MG PO TABS: 20.0000 mg | ORAL_TABLET | Freq: Every day | ORAL | Status: DC

## 2011-11-10 MED ORDER — RIVAROXABAN 20 MG PO TABS: 20.0000 mg | ORAL_TABLET | Freq: Every day | ORAL | Status: DC

## 2011-11-10 MED ORDER — LISINOPRIL 5 MG PO TABS: 5.0000 mg | ORAL_TABLET | Freq: Every day | ORAL | Status: AC

## 2011-11-10 NOTE — Progress Notes (Signed)
Patient discharged to home with wife at 70. Dan Finland, PA, provided discharge instructions to patient and wife. Understanding verbalized. Hedy Camara

## 2011-11-10 NOTE — Progress Notes (Signed)
Subjective/Complaints: No spasms no pain. Anxious to go home Review of Systems  Respiratory: Positive for cough and sputum production. Negative for hemoptysis, shortness of breath and wheezing.   All other systems reviewed and are negative.   Objective: Vital Signs: Blood pressure 115/78, pulse 73, temperature 98.3 F (36.8 C), temperature source Oral, resp. rate 18, height 5\' 10"  (1.778 m), weight 76 kg (167 lb 8.8 oz), SpO2 98.00%. No results found. No results found for this or any previous visit (from the past 72 hour(s)).   HEENT: normal Cardio: RRR Resp: CTA B/L GI: BS positive and RLQ colostomy Extremity:  No Edema Skin:   Intact Neuro: Alert/Oriented, Abnormal Sensory tingling with Lt touch LUE and LLE and Abnormal Motor 3-/5 with flexor synergy LUE, 2-/5 with ext synergy LLE except 0/5 L ankle Musc/Skel:  Normal Tone ashworth 1-2 at triceps, 2 in biceps and finger/wrist flexors Unable to oppose finger to thumb New 3-/5 L Wrist extensor strength, 3+/5 biceps and triceps Assessment/Plan: 1. Functional deficits secondary to R frontal and pariental infarct as well as decondtioning which require 3+ hours per day of interdisciplinary therapy in a comprehensive inpatient rehab setting. Physiatrist is providing close team supervision and 24 hour management of active medical problems listed below. Physiatrist and rehab team continue to assess barriers to discharge/monitor patient progress toward functional and medical goals.  DC Home today. F/u with dr.k in about 0ne month FIM: FIM - Bathing Bathing Steps Patient Completed: Chest;Right Arm;Left Arm;Abdomen;Front perineal area;Buttocks;Right upper leg;Left upper leg;Right lower leg (including foot);Left lower leg (including foot) Bathing: 5: Supervision: Safety issues/verbal cues  FIM - Upper Body Dressing/Undressing Upper body dressing/undressing steps patient completed: Thread/unthread right sleeve of pullover  shirt/dresss;Thread/unthread left sleeve of pullover shirt/dress;Put head through opening of pull over shirt/dress;Pull shirt over trunk Upper body dressing/undressing: 5: Supervision: Safety issues/verbal cues FIM - Lower Body Dressing/Undressing Lower body dressing/undressing steps patient completed: Thread/unthread right underwear leg;Thread/unthread left underwear leg;Pull underwear up/down;Thread/unthread right pants leg;Thread/unthread left pants leg;Pull pants up/down;Don/Doff right shoe;Don/Doff left shoe;Fasten/unfasten right shoe;Fasten/unfasten left shoe Lower body dressing/undressing: 4: Min-Patient completed 75 plus % of tasks  FIM - Toileting Toileting steps completed by patient: Adjust clothing prior to toileting;Performs perineal hygiene;Adjust clothing after toileting Toileting Assistive Devices: Grab bar or rail for support Toileting: 4: Steadying assist  FIM - Diplomatic Services operational officer Devices: Grab bars Toilet Transfers: 4-To toilet/BSC: Min A (steadying Pt. > 75%);4-From toilet/BSC: Min A (steadying Pt. > 75%)  FIM - Bed/Chair Transfer Bed/Chair Transfer Assistive Devices: Arm rests Bed/Chair Transfer: 5: Supine > Sit: Supervision (verbal cues/safety issues);5: Sit > Supine: Supervision (verbal cues/safety issues);5: Chair or W/C > Bed: Supervision (verbal cues/safety issues);5: Bed > Chair or W/C: Supervision (verbal cues/safety issues)  FIM - Locomotion: Wheelchair Distance: 157 Locomotion: Wheelchair: 5: Travels 150 ft or more: maneuvers on rugs and over door sills with supervision, cueing or coaxing FIM - Locomotion: Ambulation Locomotion: Ambulation Assistive Devices: Walker - Rolling;Orthosis Ambulation/Gait Assistance: 4: Min guard Locomotion: Ambulation: 2: Travels 50 - 149 ft with minimal assistance (Pt.>75%)  Comprehension Comprehension Mode: Auditory Comprehension: 6-Follows complex conversation/direction: With extra time/assistive  device  Expression Expression Mode: Verbal Expression: 6-Expresses complex ideas: With extra time/assistive device  Social Interaction Social Interaction: 6-Interacts appropriately with others with medication or extra time (anti-anxiety, antidepressant).  Problem Solving Problem Solving: 6-Solves complex problems: With extra time  Memory Memory: 6-More than reasonable amt of time  Medical Problem List and Plan:  1. Right frontal parietal CVA  after ERCP , spasticity controlled with zanaflex 2. DVT Prophylaxis/Anticoagulation: SCDs. Monitor for any signs of DVT.  3. Acute non-ST segment elevation myocardial infarction. Cardiac cath unremarkable. No complaints of chest pain or shortness of breath. Continue medical management with aspirin and Xarelto, trying to get pt assistance  4. Obstructive jaundice/history ileostomy. Monitor LFTs and routine care of ileostomy. Continue Protonix  -pt,wife had been independent with ostomy care PTA  -serial labs have been checked 5. Hypertension. Lisinopril 10 mg daily and Lopressor 25 mg twice a day. Monitor with increased mobility hild lisinopril today for BP 100 systolic 6. PAF. Xarelto 20 mg daily. Cardiac rate controlled , monitor renal fx 6. Hyperlipidemia. Crestor  7. Dysphagia. Currently on dysphagia 3 nectar liquids. We'll have speech therapy followup  8. Tone: ROM, splinting, tizanidine improved clonus but developing L ankle contracture needs PRAFO 9.  Leukocytosis resolved            10. Insomnia improved on elavil  LOS (Days) 21 A FACE TO FACE EVALUATION WAS PERFORMED  SWARTZ,ZACHARY T 11/10/2011, 7:14 AM

## 2011-11-10 NOTE — Progress Notes (Signed)
Social Work Discharge Note Discharge Note  The overall goal for the admission was met for:   Discharge location: Yes-HOME WITH WIFE TO ASSIST  Length of Stay: Yes-21 DYAS  Discharge activity level: Yes-SUPERVISION/MIN LEVEL  Home/community participation: Yes  Services provided included: MD, RD, PT, OT, SLP, RN, CM, TR, Pharmacy and SW  Financial Services: Medicare and Private Insurance: BCBS  Follow-up services arranged: Home Health: [facility]-PT,OT,RN, DME: [facility]-WHEELCHAIR,ROLLING WALKER, TUB BENCH and Patient/Family has no preference for HH/DME agencies  Comments (or additional information):FAMILY EDUCATION COMPLETED WIFE COMFORTABLE WITH PT'S CARE, PLAN TO TRANSITION TO OP IN 2-3 WEEKS  Patient/Family verbalized understanding of follow-up arrangements: Yes  Individual responsible for coordination of the follow-up plan: SHIRLEY-WIFE  Confirmed correct DME delivered: Lucy Chris 11/10/2011    Lucy Chris

## 2011-12-08 ENCOUNTER — Telehealth: Payer: Self-pay | Admitting: *Deleted

## 2011-12-08 NOTE — Telephone Encounter (Signed)
Has an appointment coming on 12/18/11. Will need a new brace for his foot before then. Please call.

## 2011-12-08 NOTE — Telephone Encounter (Signed)
Please advise 

## 2011-12-08 NOTE — Telephone Encounter (Signed)
Pt has blisters on left foot and needs new brace.  [facility] [id number].  According to [facility] they need notes for face to face visit but since pt has not been seen in office we cannot provide orders until seen.  Pt wife informed brace would be $600 without insurance.  Will provide orders as soon as pt is seen in the office.  Will try to explain to pts wife again.

## 2011-12-11 ENCOUNTER — Telehealth: Payer: Self-pay | Admitting: Physical Medicine & Rehabilitation

## 2011-12-11 NOTE — Telephone Encounter (Signed)
Lm for pt wife informing her again that until pt is seen in the office we cannot order a new brace until he is seen face to face for insurance to cover it.  Also advised her that he needs to follow up with his PCP with all other issues.

## 2011-12-11 NOTE — Telephone Encounter (Signed)
Someone please call. Was to have received a call on Saturday and has not heard from anybody.  Toes are drawing.  PLEASE CALL.

## 2011-12-18 ENCOUNTER — Ambulatory Visit (HOSPITAL_BASED_OUTPATIENT_CLINIC_OR_DEPARTMENT_OTHER): Payer: Medicare Other | Admitting: Physical Medicine & Rehabilitation

## 2011-12-18 ENCOUNTER — Encounter: Payer: Self-pay | Admitting: Physical Medicine & Rehabilitation

## 2011-12-18 ENCOUNTER — Encounter: Payer: Medicare Other | Attending: Physical Medicine & Rehabilitation

## 2011-12-18 VITALS — BP 114/71 | HR 56 | Resp 14 | Ht 70.0 in | Wt 164.0 lb

## 2011-12-18 DIAGNOSIS — I69959 Hemiplegia and hemiparesis following unspecified cerebrovascular disease affecting unspecified side: Secondary | ICD-10-CM | POA: Insufficient documentation

## 2011-12-18 DIAGNOSIS — G8110 Spastic hemiplegia affecting unspecified side: Secondary | ICD-10-CM

## 2011-12-18 DIAGNOSIS — M25519 Pain in unspecified shoulder: Secondary | ICD-10-CM | POA: Insufficient documentation

## 2011-12-18 DIAGNOSIS — M7550 Bursitis of unspecified shoulder: Secondary | ICD-10-CM

## 2011-12-18 DIAGNOSIS — M25819 Other specified joint disorders, unspecified shoulder: Secondary | ICD-10-CM | POA: Insufficient documentation

## 2011-12-18 DIAGNOSIS — M75100 Unspecified rotator cuff tear or rupture of unspecified shoulder, not specified as traumatic: Secondary | ICD-10-CM

## 2011-12-18 DIAGNOSIS — G8114 Spastic hemiplegia affecting left nondominant side: Secondary | ICD-10-CM

## 2011-12-18 MED ORDER — TIZANIDINE HCL 4 MG PO TABS: 4.0000 mg | ORAL_TABLET | Freq: Four times a day (QID) | ORAL | Status: AC | PRN

## 2011-12-18 NOTE — Progress Notes (Signed)
Subjective:    Patient ID: Shawn Wiley, male    DOB: 01-12-1936, 76 y.o.   MRN: [id number] This is a 76 year old right-handed male with previous subtotal  colectomy, ileostomy, and cholecystectomy, admitted to [facility]  October 09, 2011, with abdominal pain. The patient states that on October 06, 2011, had an abdominal discomfort in the right upper quadrant as well as  left upper quadrant. He stated that ileostomy was working regularly.  The patient became jaundice as well as mild elevation in liver function  studies. Noted bilirubin of 4.1. The patient plans for ERCP per Dr.  [name]. During the procedure, code blue was called as the  patient developed acute onset of respiratory distress and hypoxia with  bradycardia. Initial heart rates were in the 40s and oxygen saturations  in the 70s. EKG showed ST-segment elevation myocardial infarction, he  was transferred to intensive care unit. Noted by ERCP, felt to be  obstructive jaundice. He also had undergone endoscopic retrograde  cholangiopancreatography that was unremarkable. Cardiology was  consulted. Echocardiogram completed that showed mild concentric left  ventricular hypertrophy with an ejection fraction of 65%. The patient  with noted left-sided weakness and slurred speech after ERCP. MRI of  the brain June 13th showed restricted diffusion in the right frontal and  parietal cortex compatible with acute or subacute infarction. No  hemorrhage or mass noted. CT of the chest showed no pulmonary embolism.  Carotid Doppler showed calcified plaque bilaterally in the carotid  bulbs, around 50-69% stenosis suspected bilaterally. The patient was  placed on aspirin therapy as well as subcutaneous Lovenox for DVT  prophylaxis. Pertaining to his obstructive jaundice, he had been placed  on Zosyn, changed to Ceftin which was felt to be for his cholecystitis-  type problems. The patient remained afebrile. He is transferred to  [facility] October 18, 2011, for heart catheterization  after acute non-ST-segment elevation myocardial infarction with cardiac  catheterization unremarkable, advised medical regimen of continue  aspirin, but his subcutaneous Lovenox was discontinued at that time and  placed on Xarelto for paroxysmal atrial flutter.  HPI Has been receiving home health since discharge from the rehabilitation department.  Bilateral shoulder pain. Fell at home but pain complaints were prior to the fall. Has followed up with cardiology as well as surgery. As has muscle spasms in the left foot as well as the fingers of the left handPain Inventory Average Pain 10 Pain Right Now 10 My pain is sharp  In the last 24 hours, has pain interfered with the following? General activity 10 Relation with others 10 Enjoyment of life 10 What TIME of day is your pain at its worst? morning and day Sleep (in general) Poor  Pain is worse with: walking, bending, sitting and standing Pain improves with: therapy/exercise Relief from Meds: n/a  Mobility walk with assistance use a cane use a walker  Function I need assistance with the following:  dressing, bathing, toileting, meal prep, household duties and shopping Do you have any goals in this area?  yes  Neuro/Psych weakness trouble walking  Prior Studies Any changes since last visit?  no  Physicians involved in your care Any changes since last visit?  no   Family History  Problem Relation Age of Onset  . Stroke Father    History   Social History  . Marital Status: Married    Spouse Name: N/A    Number of Children: N/A  . Years of Education:  N/A   Social History Main Topics  . Smoking status: Former Games developer  . Smokeless tobacco: None  . Alcohol Use: No  . Drug Use: No  . Sexually Active:    Other Topics Concern  . None   Social History Narrative  . None   Past Surgical History  Procedure Date  . Hernia repair 2012  .  Cholecystectomy 2012  . Colon surgery 2008  . Cardiac catheterization 2013   Past Medical History  Diagnosis Date  . Coronary artery disease   . Myocardial infarction   . Stroke   . GERD (gastroesophageal reflux disease)   . Hypertension    BP 114/71  Pulse 56  Resp 14  Ht 5\' 10"  (1.778 m)  Wt 164 lb (74.39 kg)  BMI 23.53 kg/m2  SpO2 98%    Review of Systems  Musculoskeletal: Positive for myalgias, arthralgias and gait problem.  All other systems reviewed and are negative.       Objective:   Physical Exam  Constitutional: He is oriented to person, place, and time. He appears well-developed and well-nourished.  Musculoskeletal:       Right shoulder: He exhibits pain. He exhibits normal range of motion.       Left shoulder: He exhibits decreased range of motion, deformity, pain, spasm and decreased strength.  Neurological: He is alert and oriented to person, place, and time. He exhibits abnormal muscle tone.       3 minus/5 in the left deltoid, biceps, triceps, grip Tone is increased in the extensor indices on the left side tone is also increased in the left pectoralis muscle Increased toe flexors tone on the left side          Assessment & Plan:  1. Right MCA distribution infarct with left spastic hemiplegia.  2 post stroke shoulder pain the left side he has impingement in the setting of increased tone in the pectoralis and biceps. We'll start out with corticosteroid injection left subacromial bursa This will be followed by Botox injection left upper extremity to include the forearm flexors biceps and pectoralis, 300 units will be used Unusual pattern PIP and APB will be injected as well as the biceps and pectoralis  3. Right shoulder pain likely impingement syndrome from using this more due to his left-sided weakness.  4. Left lower extremity plantar flexor tone. At some point he will need a tibial nerve block.  Left subacromial bursa injection Indication  subacromial bursitis status post CVA with severe pain on overhead activities Informed consent was obtained after describing risks and benefits of the procedure with the patient is include bleeding bruising and infection he elects to proceed and has given written consent patient placed in a seated position area marked and prepped with Betadine alcohol 100 with 25-gauge needle 1 cc of 40 November cc Depo-Medrol 4 cc 1% lidocaine injected. Patient tolerated procedure well. Each instructions given.

## 2011-12-19 ENCOUNTER — Encounter: Payer: Self-pay | Admitting: Physical Medicine & Rehabilitation

## 2011-12-19 ENCOUNTER — Telehealth: Payer: Self-pay | Admitting: *Deleted

## 2011-12-19 NOTE — Telephone Encounter (Signed)
Notes received from 12/18/11 visit but nothing in note to indicate why the AFO was being ordered. Insurance requires this. Can you dictate a note indicating why you ordered the AFO so that we can fax it back over to them?

## 2011-12-19 NOTE — Telephone Encounter (Signed)
The AFO has been prescribed for a foot drop due to CVA

## 2011-12-26 ENCOUNTER — Telehealth: Payer: Self-pay | Admitting: Physical Medicine & Rehabilitation

## 2011-12-26 NOTE — Telephone Encounter (Signed)
Tried to contact [facility] but know working numbers at this time.  Faxed updated face to face encounter with statement to [facility].

## 2011-12-26 NOTE — Telephone Encounter (Signed)
PATIENT NEEDS AFO FOR FOOT DROP BADLY.  PLEASE SEND REVISED NOTE STATING PATIENTS NEED FOR AFO.  [facility] - [phone] HAS THE ORDER.

## 2011-12-26 NOTE — Progress Notes (Signed)
[name], MD 12/19/2011 4:36 PM Signed  The AFO has been prescribed for a foot drop due to CVA

## 2011-12-28 NOTE — Telephone Encounter (Signed)
Dr Wynn Banker response faxed to [facility].

## 2011-12-29 ENCOUNTER — Telehealth: Payer: Self-pay | Admitting: Physical Medicine & Rehabilitation

## 2011-12-29 DIAGNOSIS — M21379 Foot drop, unspecified foot: Secondary | ICD-10-CM

## 2011-12-29 DIAGNOSIS — I639 Cerebral infarction, unspecified: Secondary | ICD-10-CM

## 2011-12-29 NOTE — Telephone Encounter (Signed)
Spoke with Shawn Wiley and advised him dictation was faxed.  He had not talked to [facility] to get information.  OP, PT and OT order faxed to [phone] per Shawn Wiley.

## 2011-12-29 NOTE — Telephone Encounter (Signed)
Re: AFO, Dr's dictation AND rx for AFO needs to match.  The note needs to state WHY AFO is needed.  Please revise previous note.  Also, needs order for OP PT and OT.  Patient has appointment at [facility] on 01/02/12

## 2012-01-02 ENCOUNTER — Ambulatory Visit (HOSPITAL_BASED_OUTPATIENT_CLINIC_OR_DEPARTMENT_OTHER): Payer: Medicare Other | Admitting: Physical Medicine & Rehabilitation

## 2012-01-02 ENCOUNTER — Encounter: Payer: Medicare Other | Attending: Physical Medicine & Rehabilitation

## 2012-01-02 ENCOUNTER — Encounter: Payer: Self-pay | Admitting: Physical Medicine & Rehabilitation

## 2012-01-02 VITALS — BP 106/75 | HR 65 | Resp 14 | Ht 70.0 in | Wt 161.4 lb

## 2012-01-02 DIAGNOSIS — I69959 Hemiplegia and hemiparesis following unspecified cerebrovascular disease affecting unspecified side: Secondary | ICD-10-CM | POA: Insufficient documentation

## 2012-01-02 DIAGNOSIS — G8110 Spastic hemiplegia affecting unspecified side: Secondary | ICD-10-CM

## 2012-01-02 DIAGNOSIS — M25519 Pain in unspecified shoulder: Secondary | ICD-10-CM | POA: Insufficient documentation

## 2012-01-02 DIAGNOSIS — M25819 Other specified joint disorders, unspecified shoulder: Secondary | ICD-10-CM | POA: Insufficient documentation

## 2012-01-02 NOTE — Patient Instructions (Addendum)
Based on my professional opinion I feel the patient requires a left ankle-foot orthosis to help manage his left spastic hemiparesis with resulting in equino varus positioning of his left lower extremity.Failure to provide a new orthosis can result in falls and decline in functional mobility

## 2012-01-02 NOTE — Progress Notes (Signed)
  Subjective:    Patient ID: Shawn Wiley, male    DOB: [birth year], 76 y.o.   MRN: [id number]  HPI    Review of Systems     Objective:   Physical Exam        Assessment & Plan:  Botox Injection for spasticity using needle EMG guidance  Dilution: 50 Units/ml Indication: Severe spasticity which interferes with ADL,mobility and/or  hygiene and is unresponsive to medication management and other conservative care Informed consent was obtained after describing risks and benefits of the procedure with the patient. This includes bleeding, bruising, infection, excessive weakness, or medication side effects. A REMS form is on file and signed. Needle: 1inch 26 gauge needle electrode Number of units per muscle Left Pectoralis100 Left Biceps100 FCR0 FCU0 FDS0 FDP0 FPL0 Gastrosoleus0 Hamstrings0 Left Extensor indicis 50 Left APB 50 All injections were done after obtaining appropriate EMG activity and after negative drawback for blood. The patient tolerated the procedure well. Post procedure instructions were given. A followup appointment was made.

## 2012-01-16 ENCOUNTER — Ambulatory Visit (HOSPITAL_BASED_OUTPATIENT_CLINIC_OR_DEPARTMENT_OTHER): Payer: Medicare Other | Admitting: Physical Medicine & Rehabilitation

## 2012-01-16 ENCOUNTER — Ambulatory Visit (HOSPITAL_COMMUNITY)
Admission: RE | Admit: 2012-01-16 | Discharge: 2012-01-16 | Disposition: A | Discharge status: Home / Self Care | Payer: Medicare Other | Source: Ambulatory Visit | Attending: Physical Medicine & Rehabilitation | Admitting: Physical Medicine & Rehabilitation

## 2012-01-16 ENCOUNTER — Encounter: Payer: Self-pay | Admitting: Physical Medicine & Rehabilitation

## 2012-01-16 VITALS — BP 114/64 | HR 80 | Resp 16 | Ht 70.0 in | Wt 161.0 lb

## 2012-01-16 DIAGNOSIS — M25511 Pain in right shoulder: Secondary | ICD-10-CM

## 2012-01-16 DIAGNOSIS — M25512 Pain in left shoulder: Secondary | ICD-10-CM | POA: Insufficient documentation

## 2012-01-16 DIAGNOSIS — G8110 Spastic hemiplegia affecting unspecified side: Secondary | ICD-10-CM

## 2012-01-16 DIAGNOSIS — M25519 Pain in unspecified shoulder: Secondary | ICD-10-CM | POA: Insufficient documentation

## 2012-01-16 MED ORDER — TRAMADOL HCL 50 MG PO TABS: 50.0000 mg | ORAL_TABLET | Freq: Four times a day (QID) | ORAL | Status: DC | PRN

## 2012-01-16 MED ORDER — TIZANIDINE HCL 4 MG PO TABS: 4.0000 mg | ORAL_TABLET | Freq: Four times a day (QID) | ORAL | Status: DC | PRN

## 2012-01-16 NOTE — Patient Instructions (Addendum)
Your x-ray will be made at [facility].We will check both shoulders I did a left tibial nerve block with phenol today to reduce toe curling as well as foot inversion. You will wear your brace after you get it for all walking and standing activities. Continue Zanaflex 4 mg, you can try 1 or 2 tablets 3 times per day

## 2012-01-16 NOTE — Progress Notes (Signed)
  Subjective:    Patient ID: Shawn Wiley, male    DOB: [birth year], 76 y.o.   MRN: [id number]  HPI Pain in left shoulder no better after Botox to the pectoralis as well as biceps. Also has pain in right shoulder Here originally for left tibial nerve block. Continues to have complaints of toe curling as well as foot turning inwards.   Review of Systems No significant neck pain. No hip soreness or aching.    Objective:   Physical Exam  Left hemiplegia with spasticity Left shoulder subluxation Pain with overhead movement of both arms      Assessment & Plan:  1. Left spastic hemiparesis due to CVA will proceed with tibial nerve block today  2. Bilateral shoulder pain I suspect she may have some rotator cuff arthropathy versus osteoarthritis we'll check bilateral x-rays. Next visit may need to do shoulder ultrasound if x-rays are not revealing.  Left tibial nerve block with e-stim guidance Indication left equinovalgus spasticity due to his stroke Informed consent was obtained after describing risks and benefits of the procedure with the patient these include bleeding bruising and infection he elects to proceed and has given written consent Patient placed in a right lateral decubitus position and she stimulator used to obtain plantar flexion twitch. After twitch was confirmed the area was marked and prepped with Betadine and then entered with a 22-gauge 40 mm needle electrode. Electrical stimulation obtain plantar flexion twitch and confirmed at less than 1 milliamp. Then 4 cc of 5% phenol were injected after negative draw back for blood  Patient tolerated procedure well  Patient had post procedure instructions given

## 2012-01-18 ENCOUNTER — Telehealth: Payer: Self-pay

## 2012-01-18 NOTE — Telephone Encounter (Signed)
Patients wife called requesting xray results.  Lm advising her that we are waiting on provider to review them.  Please advise.

## 2012-01-19 NOTE — Telephone Encounter (Signed)
Patients wife informed.  Will call if an earlier appointment opens.

## 2012-01-19 NOTE — Telephone Encounter (Signed)
Shoulder x-rays are normal. An x-ray will not show the rotator cuff. For that we must do an ultrasound of the shoulder which we can perform in this office Please schedule

## 2012-01-26 ENCOUNTER — Encounter: Payer: Self-pay | Admitting: Physical Medicine & Rehabilitation

## 2012-01-26 ENCOUNTER — Ambulatory Visit (HOSPITAL_BASED_OUTPATIENT_CLINIC_OR_DEPARTMENT_OTHER): Payer: Medicare Other | Admitting: Physical Medicine & Rehabilitation

## 2012-01-26 VITALS — BP 113/69 | HR 83 | Resp 14 | Ht 70.0 in | Wt 161.0 lb

## 2012-01-26 DIAGNOSIS — G8929 Other chronic pain: Secondary | ICD-10-CM

## 2012-01-26 DIAGNOSIS — M25519 Pain in unspecified shoulder: Secondary | ICD-10-CM

## 2012-01-26 DIAGNOSIS — M19019 Primary osteoarthritis, unspecified shoulder: Secondary | ICD-10-CM

## 2012-01-26 MED ORDER — GABAPENTIN 100 MG PO CAPS: 100.0000 mg | ORAL_CAPSULE | Freq: Three times a day (TID) | ORAL | Status: DC

## 2012-01-26 MED ORDER — TIZANIDINE HCL 4 MG PO TABS: 6.0000 mg | ORAL_TABLET | Freq: Four times a day (QID) | ORAL | Status: DC | PRN

## 2012-01-26 NOTE — Progress Notes (Signed)
  Subjective:    Patient ID: Shawn Wiley, male    DOB: Sep 24, 1935, 76 y.o.   MRN: [id number]  HPI    Review of Systems     Objective:   Physical Exam        Assessment & Plan:  Left shoulder ultrasound Indication hemiplegic shoulder pain not responsive to subacromial injection 12 Hz linear transducer Patient in seated position Left bicipital groove identified short axis and long axis views. No evidence of bicipital tendon dislocation. Tendon Thickness, 2.1 mm, reduced. No evidence of fluid in the tendon sheath Left supraspinatus tendon long axis view no evidence of tear.Tendon thickness 6.7 mm Borderline increased Left supraspinatus tendon short axis view no evidence of tear Left infraspinatus tendon no evidence of tear on the long axis and short axis views Left a.c. Joint shows severe osteoarthritis with joint capsule thickening Subscapularis long axis view. 4.5 mm thick  Impression 1 abnormal study  2. Left a.c. Joint arthropathy  3. Borderline left supraspinatus tendinosis

## 2012-02-15 ENCOUNTER — Ambulatory Visit: Payer: Self-pay | Admitting: Physical Medicine & Rehabilitation

## 2012-02-27 ENCOUNTER — Ambulatory Visit (HOSPITAL_BASED_OUTPATIENT_CLINIC_OR_DEPARTMENT_OTHER): Payer: Medicare Other | Admitting: Physical Medicine & Rehabilitation

## 2012-02-27 ENCOUNTER — Encounter: Payer: Medicare Other | Attending: Physical Medicine & Rehabilitation

## 2012-02-27 ENCOUNTER — Encounter: Payer: Self-pay | Admitting: Physical Medicine & Rehabilitation

## 2012-02-27 VITALS — BP 127/71 | HR 96 | Resp 14 | Wt 156.2 lb

## 2012-02-27 DIAGNOSIS — M25519 Pain in unspecified shoulder: Secondary | ICD-10-CM | POA: Insufficient documentation

## 2012-02-27 DIAGNOSIS — M25819 Other specified joint disorders, unspecified shoulder: Secondary | ICD-10-CM | POA: Insufficient documentation

## 2012-02-27 DIAGNOSIS — M7500 Adhesive capsulitis of unspecified shoulder: Secondary | ICD-10-CM

## 2012-02-27 DIAGNOSIS — I69959 Hemiplegia and hemiparesis following unspecified cerebrovascular disease affecting unspecified side: Secondary | ICD-10-CM | POA: Insufficient documentation

## 2012-02-27 DIAGNOSIS — G8110 Spastic hemiplegia affecting unspecified side: Secondary | ICD-10-CM

## 2012-02-27 NOTE — Progress Notes (Signed)
Subjective:    Patient ID: Shawn Wiley, male    DOB: June 27, 1935, 76 y.o.   MRN: [id number]  HPI Continues to have left shoulder pain. Has tried subacromial injection without much help. Continues to complain of stiffness in the shoulder. Shoulder ultrasound revealed tendinosis of the supraspinatus tendon as well as a.c. Joint arthritis. Results discussed with patient Tried Neurontin 100 mg 3 times a day but states he felt his blood pressure was going down. Also continues to complain of toe curling on the left side. Also complains of left hand fisting. Is on Zanaflex 4 mg 4 times per day already.Therapist asked about baclofen Tried driving with his grandson who is a Visual merchandiser. Tried a spinner knob. Did well with this. Pain Inventory Average Pain 4 Pain Right Now 2 My pain is sharp and big toe pain  In the last 24 hours, has pain interfered with the following? General activity 0 Relation with others 0 Enjoyment of life 0 What TIME of day is your pain at its worst? when walking (toes) Sleep (in general) Good  Pain is worse with: walking Pain improves with: rest Relief from Meds: no pain medication  Mobility use a cane ability to climb steps?  yes do you drive?  no   Wants to discuss driving today  Function retired  Neuro/Psych No problems in this area  Prior Studies Any changes since last visit?  no  Physicians involved in your care Any changes since last visit?  no   Family History  Problem Relation Age of Onset  . Stroke Father    History   Social History  . Marital Status: Married    Spouse Name: N/A    Number of Children: N/A  . Years of Education: N/A   Social History Main Topics  . Smoking status: Former Smoker    Quit date: 05/21/2011  . Smokeless tobacco: Former Neurosurgeon    Quit date: 01/01/2010  . Alcohol Use: No  . Drug Use: No  . Sexually Active: None   Other Topics Concern  . None   Social History Narrative  . None   Past Surgical  History  Procedure Date  . Hernia repair 2012  . Cholecystectomy 2012  . Shawn surgery 2008  . Cardiac catheterization 2013   Past Medical History  Diagnosis Date  . Coronary artery disease   . Myocardial infarction   . Stroke   . GERD (gastroesophageal reflux disease)   . Hypertension    BP 127/71  Pulse 96  Resp 14  Wt 156 lb 3.2 oz (70.852 kg)  SpO2 97%   Review of Systems  Cardiovascular: Positive for leg swelling.  Musculoskeletal: Positive for gait problem.  All other systems reviewed and are negative.       Objective:   Physical Exam  Gen. No acute distress Mood and affect are appropriate Motor strength is 3/5 in the left deltoid bicep tricep 3 minus at the finger flexors and extensors. He has some increased tone at the finger flexors although not clearly velocity related Right upper extremity has normal strength Left lower extremity has foot drop an AFO knee extensor strength is 4/5 Left shoe has a snug fit. He does have some toe curling with within his shoe as well as mild foot inversion on the left side      Assessment & Plan:  1. R Frontal and parietal infarct with L spastic hemiparesis 2.  Shoulder pain L with frozen shoulder will inject joint  today 3.  Spasticity in L forearm and L toe flexors, not responsive to phenol or Zanaflex or PT, will do Botox in early Dec  Left glenohumeral injection Indication left frozen shoulder Pain is only partially response to medication management and other conservative care including physical therapy Informed consent was obtained after describing risks and benefits of the procedure with the patient these include bleeding bruising and infection he elects to proceed and has given written consent Patient placed in a seated position. Posterior lateral approach utilized 25-gauge ASAP needle was inserted to hold after negative draw back for blood 1 cc of 40 mg per cc Depo-Medrol and 4 cc 1% lidocaine were injected. Patient  tolerated procedure well. Post procedure instructions given

## 2012-02-27 NOTE — Patient Instructions (Addendum)
We'll do Botox in december on the left forearm and muscles causing toe curling on the left side  Restart gabapentin 1 tablet at night for 3 days 1 tablet twice a day for 3 days 1 tablet 3 times a day from then on   You may drive short distances during the daytime on smaller roads.

## 2012-04-19 ENCOUNTER — Ambulatory Visit (HOSPITAL_BASED_OUTPATIENT_CLINIC_OR_DEPARTMENT_OTHER): Payer: Medicare Other | Admitting: Physical Medicine & Rehabilitation

## 2012-04-19 ENCOUNTER — Encounter: Payer: Self-pay | Admitting: Physical Medicine & Rehabilitation

## 2012-04-19 ENCOUNTER — Encounter: Payer: Medicare Other | Attending: Physical Medicine & Rehabilitation

## 2012-04-19 VITALS — BP 93/54 | HR 82 | Resp 14 | Wt 163.0 lb

## 2012-04-19 DIAGNOSIS — M25519 Pain in unspecified shoulder: Secondary | ICD-10-CM | POA: Insufficient documentation

## 2012-04-19 DIAGNOSIS — M25819 Other specified joint disorders, unspecified shoulder: Secondary | ICD-10-CM | POA: Insufficient documentation

## 2012-04-19 DIAGNOSIS — I69959 Hemiplegia and hemiparesis following unspecified cerebrovascular disease affecting unspecified side: Secondary | ICD-10-CM | POA: Insufficient documentation

## 2012-04-19 DIAGNOSIS — G8110 Spastic hemiplegia affecting unspecified side: Secondary | ICD-10-CM

## 2012-04-19 MED ORDER — TIZANIDINE HCL 4 MG PO TABS: 6.0000 mg | ORAL_TABLET | Freq: Four times a day (QID) | ORAL | Status: DC | PRN

## 2012-04-19 NOTE — Patient Instructions (Signed)
OnabotulinumtoxinA injection (Medical Use) What is this medicine? ONABOTULINUMTOXINA is a neuro-muscular blocker. This medicine is used to treat crossed eyes, eyelid spasms, severe neck muscle spasms, and elbow, wrist, and finger muscle spasms. It is also used to treat excessive underarm sweating, to prevent chronic migraine headaches, and to treat loss of bladder control due to neurologic conditions such as multiple sclerosis or spinal cord injury. This medicine may be used for other purposes; ask your health care provider or pharmacist if you have questions. What should I tell my health care provider before I take this medicine? They need to know if you have any of these conditions: -breathing problems -cerebral palsy spasms -difficulty urinating -heart problems -history of surgery where this medicine is going to be used -infection at the site where this medicine is going to be used -myasthenia gravis or other neurologic disease -nerve or muscle disease -surgery plans -take medicines that treat or prevent blood clots -thyroid problems -an unusual or allergic reaction to botulinum toxin, albumin, other medicines, foods, dyes, or preservatives -pregnant or trying to get pregnant -breast-feeding How should I use this medicine? This medicine is for injection into a muscle. It is given by a health care professional in a hospital or clinic setting. Talk to your pediatrician regarding the use of this medicine in children. While this drug may be prescribed for children as young as 12 years old for selected conditions, precautions do apply. Overdosage: If you think you have taken too much of this medicine contact a poison control center or emergency room at once. NOTE: This medicine is only for you. Do not share this medicine with others. What if I miss a dose? This does not apply. What may interact with this medicine? -aminoglycoside antibiotics like gentamicin, neomycin, tobramycin -muscle  relaxants -other botulinum toxin injections This list may not describe all possible interactions. Give your health care provider a list of all the medicines, herbs, non-prescription drugs, or dietary supplements you use. Also tell them if you smoke, drink alcohol, or use illegal drugs. Some items may interact with your medicine. What should I watch for while using this medicine? Visit your doctor for regular check ups. This medicine will cause weakness in the muscle where it is injected. Tell your doctor if you feel unusually weak in other muscles. Get medical help right away if you have problems with breathing, swallowing, or talking. This medicine might make your eyelids droop or make you see blurry or double. If you have weak muscles or trouble seeing do not drive a car, use machinery, or do other dangerous activities. This medicine contains albumin from human blood. It may be possible to pass an infection in this medicine, but no cases have been reported. Talk to your doctor about the risks and benefits of this medicine. If your activities have been limited by your condition, go back to your regular routine slowly after treatment with this medicine. What side effects may I notice from receiving this medicine? Side effects that you should report to your doctor or health care professional as soon as possible: -allergic reactions like skin rash, itching or hives, swelling of the face, lips, or tongue -breathing problems -changes in vision -chest pain or tightness -eye irritation, pain -fast, irregular heartbeat -infection -numbness -speech problems -swallowing problems -unusual weakness Side effects that usually do not require medical attention (report to your doctor or health care professional if they continue or are bothersome): -bruising or pain at site where injected -drooping eyelid -dry eyes   or mouth -headache -muscles aches, pains -sensitivity to light -tearing This list may not  describe all possible side effects. Call your doctor for medical advice about side effects. You may report side effects to FDA at 1-800-FDA-1088. Where should I keep my medicine? This drug is given in a hospital or clinic and will not be stored at home. NOTE: This sheet is a summary. It may not cover all possible information. If you have questions about this medicine, talk to your doctor, pharmacist, or health care provider.  2013, [employer]vier/[employer]. (02/22/2010 8:06:56 AM)  

## 2012-04-19 NOTE — Progress Notes (Signed)
Botox Injection for spasticity using needle EMG guidance  Dilution: 50 Units/ml Indication: Severe spasticity which interferes with ADL,mobility and/or  hygiene and is unresponsive to medication management and other conservative care Informed consent was obtained after describing risks and benefits of the procedure with the patient. This includes bleeding, bruising, infection, excessive weakness, or medication side effects. A REMS form is on file and signed. Needle: 25 gauge 50mm needle electrode Number of units per muscle Pectoralis50 Biceps0 FCR50 FCU50 FDS50 FDP0 FPL0 Flexor digitorum longus 75 Flexor hallucis longus 75 Flexor Hallucis brevis 50 All injections were done after obtaining appropriate EMG activity and after negative drawback for blood. The patient tolerated the procedure well. Post procedure instructions were given. A followup appointment was made.

## 2012-05-15 ENCOUNTER — Telehealth: Payer: Self-pay | Admitting: *Deleted

## 2012-05-15 NOTE — Telephone Encounter (Signed)
Patient had Botox injection on 04/20/12. His hand is worse since the injections. Patient is unable to open his hand. Would like to know what they should do? Please advise.

## 2012-05-16 NOTE — Telephone Encounter (Signed)
This is normal and will wear off.  THis is how Botox works to decrease excess tone

## 2012-05-16 NOTE — Telephone Encounter (Signed)
Notified patient.

## 2012-05-22 ENCOUNTER — Other Ambulatory Visit: Payer: Self-pay | Admitting: Physical Medicine & Rehabilitation

## 2012-05-27 ENCOUNTER — Telehealth: Payer: Self-pay | Admitting: *Deleted

## 2012-05-27 MED ORDER — TIZANIDINE HCL 4 MG PO TABS: 6.0000 mg | ORAL_TABLET | Freq: Four times a day (QID) | ORAL | Status: DC | PRN

## 2012-05-27 NOTE — Telephone Encounter (Signed)
Needs refill on tizanidine. Notified patients wife of refill.

## 2012-05-29 ENCOUNTER — Other Ambulatory Visit: Payer: Self-pay | Admitting: Family Medicine

## 2012-06-07 ENCOUNTER — Ambulatory Visit (HOSPITAL_BASED_OUTPATIENT_CLINIC_OR_DEPARTMENT_OTHER): Payer: Medicare Other | Admitting: Physical Medicine & Rehabilitation

## 2012-06-07 ENCOUNTER — Encounter: Payer: Self-pay | Admitting: Physical Medicine & Rehabilitation

## 2012-06-07 ENCOUNTER — Encounter: Payer: Medicare Other | Attending: Physical Medicine & Rehabilitation

## 2012-06-07 VITALS — BP 113/70 | HR 84 | Resp 14 | Ht 70.0 in | Wt 167.0 lb

## 2012-06-07 DIAGNOSIS — I69959 Hemiplegia and hemiparesis following unspecified cerebrovascular disease affecting unspecified side: Secondary | ICD-10-CM | POA: Insufficient documentation

## 2012-06-07 DIAGNOSIS — G8110 Spastic hemiplegia affecting unspecified side: Secondary | ICD-10-CM

## 2012-06-07 DIAGNOSIS — I639 Cerebral infarction, unspecified: Secondary | ICD-10-CM

## 2012-06-07 DIAGNOSIS — M25519 Pain in unspecified shoulder: Secondary | ICD-10-CM | POA: Insufficient documentation

## 2012-06-07 DIAGNOSIS — M25819 Other specified joint disorders, unspecified shoulder: Secondary | ICD-10-CM | POA: Insufficient documentation

## 2012-06-07 DIAGNOSIS — I6350 Cerebral infarction due to unspecified occlusion or stenosis of unspecified cerebral artery: Secondary | ICD-10-CM

## 2012-06-07 MED ORDER — TIZANIDINE HCL 2 MG PO TABS: 2.0000 mg | ORAL_TABLET | Freq: Four times a day (QID) | ORAL | Status: DC | PRN

## 2012-06-07 NOTE — Patient Instructions (Addendum)
Botox injection next visit. Will reduce the dose in the left wrist and forearm, will increase the dose in the left leg

## 2012-06-07 NOTE — Progress Notes (Signed)
Subjective:    Patient ID: Shawn Wiley, male    DOB: 1936-01-05, 77 y.o.   MRN: [id number]  HPI Drowsiness from zanaflex, takes only 1/2 tablet during the day, full tablet at night 04/19/2012 Number of units per muscle Pectoralis50 Biceps0 FCR50 FCU50 FDS50 FDP0 FPL0 Flexor digitorum longus 75 Flexor hallucis longus 75 Flexor Hallucis brevis 50 Pain Inventory Average Pain 2 Pain Right Now 2 My pain is intermittent  In the last 24 hours, has pain interfered with the following? General activity 2 Relation with others 2 Enjoyment of life 2 What TIME of day is your pain at its worst? evening Sleep (in general) Fair  Pain is worse with: inactivity Pain improves with: rest Relief from Meds: 2  Mobility use a cane how many minutes can you walk? 15 ability to climb steps?  no do you drive?  yes  Function retired  Neuro/Psych trouble walking  Prior Studies Any changes since last visit?  no  Physicians involved in your care Any changes since last visit?  no   Family History  Problem Relation Age of Onset  . Stroke Father    History   Social History  . Marital Status: Married    Spouse Name: N/A    Number of Children: N/A  . Years of Education: N/A   Social History Main Topics  . Smoking status: Former Smoker    Quit date: 05/21/2011  . Smokeless tobacco: Former Neurosurgeon    Quit date: 01/01/2010  . Alcohol Use: No  . Drug Use: No  . Sexually Active: None   Other Topics Concern  . None   Social History Narrative  . None   Past Surgical History  Procedure Date  . Hernia repair 2012  . Cholecystectomy 2012  . Colon surgery 2008  . Cardiac catheterization 2013   Past Medical History  Diagnosis Date  . Coronary artery disease   . Myocardial infarction   . Stroke   . GERD (gastroesophageal reflux disease)   . Hypertension    BP 113/70  Pulse 84  Resp 14  Ht 5\' 10"  (1.778 m)  Wt 167 lb (75.751 kg)  BMI 23.96 kg/m2  SpO2  100%   Review of Systems  Musculoskeletal: Positive for gait problem.  All other systems reviewed and are negative.       Objective:   Physical Exam  Increased MCP flexion tone Ashworth grade 2 on the left side. Finger flexors and wrist flexors Ashworth 1 Toe flexors and great toe flexors Ashworth 1 Equinovarus spasticity left ankle Ambulates with an AF O. No evidence of toe drag using cane Motor strength is 3/5 in the left deltoid bicep tricep 3 minus at the finger flexors and extensors. 4/5 strength in the left quadricep 0 at the left ankle dorsiflexors 1 at the plantar flexors with tone related Right side is 5/5 in both upper and lower extremity      Assessment & Plan:  1. 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 His biggest concern is equinovalgus spasticity. I would recommend treatment of the left tibialis posterior when we repeat Botox in About 6 weeks. We can also reduce the doseIn the upper extremity. We will not inject the pectoralis. We will reduce FDS and FCU FCR to 25 units. That will be 75 units in the left upper extremity and 225 units in the left lower extremity Plan for flexor digitorum longus 75 units Flexor hallucis longus 75 units Flexor pollicis brevis 25 units  Tibialis posterior 50 units

## 2012-07-29 ENCOUNTER — Other Ambulatory Visit: Payer: Self-pay | Admitting: Family Medicine

## 2012-08-02 ENCOUNTER — Encounter: Payer: Medicare Other | Attending: Physical Medicine & Rehabilitation

## 2012-08-02 ENCOUNTER — Encounter: Payer: Self-pay | Admitting: Physical Medicine & Rehabilitation

## 2012-08-02 ENCOUNTER — Ambulatory Visit (HOSPITAL_BASED_OUTPATIENT_CLINIC_OR_DEPARTMENT_OTHER): Payer: Medicare Other | Admitting: Physical Medicine & Rehabilitation

## 2012-08-02 VITALS — BP 114/66 | HR 91 | Resp 14 | Wt 171.2 lb

## 2012-08-02 DIAGNOSIS — G8110 Spastic hemiplegia affecting unspecified side: Secondary | ICD-10-CM

## 2012-08-02 DIAGNOSIS — M62838 Other muscle spasm: Secondary | ICD-10-CM | POA: Insufficient documentation

## 2012-08-02 NOTE — Progress Notes (Signed)
  Subjective:    Patient ID: Shawn Wiley, male    DOB: 1936-03-05, 77 y.o.   MRN: [id number]  HPI    Review of Systems     Objective:   Physical Exam        Assessment & Plan:  Botox Injection for spasticity using needle EMG guidance  Dilution: 50 Units/ml Indication: Severe spasticity which interferes with ADL,mobility and/or  hygiene and is unresponsive to medication management and other conservative care Informed consent was obtained after describing risks and benefits of the procedure with the patient. This includes bleeding, bruising, infection, excessive weakness, or medication side effects. A REMS form is on file and signed. Needle: 25 gauge 50mm needle electrode Number of units per muscle Tibialis posterior 25 x 3=75 Flexor digitorum longus 25x3=75 Flexor hallucis longus 25x3=75 Flexor Hallucis brevis 25x2=50 Medial soleus 25x1=25 All injections were done after obtaining appropriate EMG activity and after negative drawback for blood. The patient tolerated the procedure well. Post procedure instructions were given. A followup appointment was made.

## 2012-08-02 NOTE — Patient Instructions (Signed)
Needle: 25 gauge 50mm needle electrode Number of units per muscle BOTOX Tibialis posterior 25 x 3=75 Flexor digitorum longus 25x3=75 Flexor hallucis longus 25x3=75 Flexor Hallucis brevis 25x2=50 Medial soleus 25x1=25  No restrictions, will take one week to take effect

## 2012-08-30 ENCOUNTER — Ambulatory Visit (HOSPITAL_BASED_OUTPATIENT_CLINIC_OR_DEPARTMENT_OTHER): Payer: Medicare Other | Admitting: Physical Medicine & Rehabilitation

## 2012-08-30 ENCOUNTER — Encounter: Payer: Self-pay | Admitting: Physical Medicine & Rehabilitation

## 2012-08-30 ENCOUNTER — Encounter: Payer: Medicare Other | Attending: Physical Medicine & Rehabilitation

## 2012-08-30 VITALS — BP 142/87 | HR 88 | Resp 14 | Ht 70.0 in | Wt 168.6 lb

## 2012-08-30 DIAGNOSIS — G8110 Spastic hemiplegia affecting unspecified side: Secondary | ICD-10-CM

## 2012-08-30 DIAGNOSIS — M25519 Pain in unspecified shoulder: Secondary | ICD-10-CM | POA: Insufficient documentation

## 2012-08-30 DIAGNOSIS — M25819 Other specified joint disorders, unspecified shoulder: Secondary | ICD-10-CM | POA: Insufficient documentation

## 2012-08-30 DIAGNOSIS — I69959 Hemiplegia and hemiparesis following unspecified cerebrovascular disease affecting unspecified side: Secondary | ICD-10-CM | POA: Insufficient documentation

## 2012-08-30 NOTE — Progress Notes (Signed)
Subjective:    Patient ID: Shawn Wiley, male    DOB: 12-17-1935, 77 y.o.   MRN: [id number] Botox injection 08/02/12 Number of units per muscle Tibialis posterior 25 x 3=75 Flexor digitorum longus 25x3=75 Flexor hallucis longus 25x3=75 Flexor Hallucis brevis 25x2=50 Medial soleus 25x1=25 HPI Left shoulder mild pain only with ROM   Pain Inventory Average Pain 0 Pain Right Now 0 My pain is no pain  In the last 24 hours, has pain interfered with the following? General activity 0 Relation with others 0 Enjoyment of life 0 What TIME of day is your pain at its worst? no pain Sleep (in general) Fair  Pain is worse with: no pain Pain improves with: no pain Relief from Meds: no pain  Mobility use a cane  Function retired  Neuro/Psych No problems in this area  Prior Studies Any changes since last visit?  yes CT/MRI  Physicians involved in your care Any changes since last visit?  no   Family History  Problem Relation Age of Onset  . Stroke Father    History   Social History  . Marital Status: Married    Spouse Name: N/A    Number of Children: N/A  . Years of Education: N/A   Social History Main Topics  . Smoking status: Former Smoker    Quit date: 05/21/2011  . Smokeless tobacco: Former Neurosurgeon    Quit date: 01/01/2010  . Alcohol Use: No  . Drug Use: No  . Sexually Active: None   Other Topics Concern  . None   Social History Narrative  . None   Past Surgical History  Procedure Laterality Date  . Hernia repair  2012  . Cholecystectomy  2012  . Colon surgery  2008  . Cardiac catheterization  2013   Past Medical History  Diagnosis Date  . Coronary artery disease   . Myocardial infarction   . Stroke   . GERD (gastroesophageal reflux disease)   . Hypertension    BP 142/87  Pulse 88  Resp 14  Ht 5\' 10"  (1.778 m)  Wt 168 lb 9.6 oz (76.476 kg)  BMI 24.19 kg/m2  SpO2 98%   Review of Systems  Musculoskeletal: Positive for gait problem.  All  other systems reviewed and are negative.       Objective:   Physical Exam  Nursing note and vitals reviewed. Constitutional: He appears well-developed and well-nourished.  Psychiatric: He has a normal mood and affect.  Gen. No acute distress Mood and affect are appropriate Motor strength is 3/5 in the left deltoid bicep tricep 3 minus at the finger flexors and extensors. He has some increased tone at the finger flexors although not clearly velocity related Right upper extremity has normal strength Left shoulder has full antigravity range of motion active with only minimal pain at end range Left lower extremity has foot drop an AFO knee extensor strength is 4/5 Left shoe has a snug fit. He does have some toe curling with within his shoe as well as mild foot inversion on the left side        Assessment & Plan:  1.  Right MCA infarct with left spastic hemiplegia. Overall improved in the left arm. No significant improvement after Botox in the left toe and ankle flexors. Also have tried phenol to the left tibial nerve without improvement. Off of Zanaflex secondary to elevated liver functions. Was only taking 2 mg at night so unlikely to be the etiology. If nocturnal spasms  become more of a problem we can try some baclofen. Otherwise we will followup in about 3 months

## 2012-09-05 ENCOUNTER — Telehealth: Payer: Self-pay | Admitting: Physical Medicine & Rehabilitation

## 2012-09-16 ENCOUNTER — Telehealth: Payer: Self-pay | Admitting: Physical Medicine & Rehabilitation

## 2012-11-26 ENCOUNTER — Ambulatory Visit: Payer: BLUE CROSS/BLUE SHIELD | Admitting: Physical Medicine & Rehabilitation

## 2012-11-28 ENCOUNTER — Ambulatory Visit: Payer: BLUE CROSS/BLUE SHIELD | Admitting: Physical Medicine & Rehabilitation

## 2012-11-29 ENCOUNTER — Ambulatory Visit: Payer: BLUE CROSS/BLUE SHIELD | Admitting: Physical Medicine & Rehabilitation

## 2012-12-11 ENCOUNTER — Other Ambulatory Visit: Payer: Self-pay | Admitting: Specialist

## 2012-12-11 DIAGNOSIS — R17 Unspecified jaundice: Secondary | ICD-10-CM

## 2012-12-14 ENCOUNTER — Ambulatory Visit
Admission: RE | Admit: 2012-12-14 | Discharge: 2012-12-14 | Disposition: A | Discharge status: Home / Self Care | Payer: Medicare Other | Source: Ambulatory Visit | Attending: Specialist | Admitting: Specialist

## 2012-12-14 DIAGNOSIS — R17 Unspecified jaundice: Secondary | ICD-10-CM

## 2012-12-14 MED ORDER — GADOBENATE DIMEGLUMINE 529 MG/ML IV SOLN
15.0000 mL | Freq: Once | INTRAVENOUS | Status: AC | PRN
  Administered 2012-12-14: 15 mL via INTRAVENOUS

## 2012-12-15 ENCOUNTER — Other Ambulatory Visit: Payer: Self-pay | Admitting: Specialist

## 2012-12-15 DIAGNOSIS — R17 Unspecified jaundice: Secondary | ICD-10-CM

## 2013-04-04 ENCOUNTER — Encounter: Payer: Self-pay | Admitting: Physical Medicine & Rehabilitation

## 2013-04-04 ENCOUNTER — Encounter: Payer: Medicare Other | Attending: Physical Medicine & Rehabilitation

## 2013-04-04 ENCOUNTER — Ambulatory Visit (HOSPITAL_BASED_OUTPATIENT_CLINIC_OR_DEPARTMENT_OTHER): Payer: Medicare Other | Admitting: Physical Medicine & Rehabilitation

## 2013-04-04 VITALS — BP 131/70 | HR 94 | Resp 14 | Ht 70.0 in | Wt 168.6 lb

## 2013-04-04 DIAGNOSIS — K219 Gastro-esophageal reflux disease without esophagitis: Secondary | ICD-10-CM | POA: Insufficient documentation

## 2013-04-04 DIAGNOSIS — I69959 Hemiplegia and hemiparesis following unspecified cerebrovascular disease affecting unspecified side: Secondary | ICD-10-CM | POA: Insufficient documentation

## 2013-04-04 DIAGNOSIS — I6350 Cerebral infarction due to unspecified occlusion or stenosis of unspecified cerebral artery: Secondary | ICD-10-CM

## 2013-04-04 DIAGNOSIS — I639 Cerebral infarction, unspecified: Secondary | ICD-10-CM

## 2013-04-04 DIAGNOSIS — I10 Essential (primary) hypertension: Secondary | ICD-10-CM | POA: Insufficient documentation

## 2013-04-04 DIAGNOSIS — G8110 Spastic hemiplegia affecting unspecified side: Secondary | ICD-10-CM

## 2013-04-04 DIAGNOSIS — I252 Old myocardial infarction: Secondary | ICD-10-CM | POA: Insufficient documentation

## 2013-04-04 DIAGNOSIS — I2510 Atherosclerotic heart disease of native coronary artery without angina pectoris: Secondary | ICD-10-CM | POA: Insufficient documentation

## 2013-04-04 MED ORDER — TIZANIDINE HCL 2 MG PO TABS: 2.0000 mg | ORAL_TABLET | Freq: Every day | ORAL | Status: DC

## 2013-04-04 NOTE — Patient Instructions (Signed)
Botox injection next visit  Prescription for ankle strap left AFO  Prescription for Zanaflex sent to pharmacy

## 2013-04-04 NOTE — Progress Notes (Signed)
Subjective:    Patient ID: Shawn Wiley, male    DOB: 24-May-1935, 77 y.o.   MRN: [id number]  HPI  Lumbrical spasticity. Unable to extend MCPs. No trauma to that area. Last Botox injection for 4-14  Needs a prescription for Zanaflex  Also needs adjustment to his left AFO Pain Inventory Average Pain 5 Pain Right Now 5 My pain is aching  In the last 24 hours, has pain interfered with the following? General activity 7 Relation with others 7 Enjoyment of life 7 What TIME of day is your pain at its worst? constant all day Sleep (in general) Poor  Pain is worse with: walking and some activites Pain improves with: rest Relief from Meds: 0  Mobility walk with assistance use a cane how many minutes can you walk? 10 ability to climb steps?  no do you drive?  yes needs help with transfers  Function Do you have any goals in this area?  yes  Neuro/Psych bladder control problems numbness tremor trouble walking confusion depression anxiety  Prior Studies Any changes since last visit?  no  Physicians involved in your care Any changes since last visit?  no   Family History  Problem Relation Age of Onset  . Stroke Father    History   Social History  . Marital Status: Married    Spouse Name: N/A    Number of Children: N/A  . Years of Education: N/A   Social History Main Topics  . Smoking status: Former Smoker    Quit date: 05/21/2011  . Smokeless tobacco: Former Neurosurgeon    Quit date: 01/01/2010  . Alcohol Use: No  . Drug Use: No  . Sexual Activity: None   Other Topics Concern  . None   Social History Narrative  . None   Past Surgical History  Procedure Laterality Date  . Hernia repair  2012  . Cholecystectomy  2012  . Colon surgery  2008  . Cardiac catheterization  2013   Past Medical History  Diagnosis Date  . Coronary artery disease   . Myocardial infarction   . Stroke   . GERD (gastroesophageal reflux disease)   . Hypertension    BP  131/70  Pulse 94  Resp 14  Ht 5\' 10"  (1.778 m)  Wt 168 lb 9.6 oz (76.476 kg)  BMI 24.19 kg/m2  SpO2 95%      Review of Systems  Respiratory: Positive for cough and wheezing.   Cardiovascular: Positive for leg swelling.  Genitourinary:       Bladder control problems  Musculoskeletal: Positive for gait problem.  Neurological: Positive for tremors and numbness.  Psychiatric/Behavioral: Positive for confusion and dysphoric mood. The patient is nervous/anxious.   All other systems reviewed and are negative.       Objective:   Physical Exam  Unable to extend MCPs able to extend PIPs and DIPs digits 2 through 5 left hand.  Increased inversion at the left ankle.  single channel dual upright AFO attached to shoe  Left spastic hemiplegia motor strength 3+ left shoulder 3+ left bicep and tricep Lacks approximately 20 of extension left elbow  3 minus left knee flex  4/5 left knee extensor    Assessment & Plan:  1. Left spastic hemiplegia secondary to right MCA infarct overall has made progress but still has significant weakness in spasticity.  Advise for T strap to attach to left AFO  Advise Botox injection lumbrical muscles. 15 units into each of 3 muscles.

## 2013-04-10 ENCOUNTER — Encounter: Payer: Self-pay | Admitting: Physical Medicine & Rehabilitation

## 2013-04-10 ENCOUNTER — Ambulatory Visit: Payer: Medicare Other | Admitting: Physical Medicine and Rehabilitation

## 2013-04-10 ENCOUNTER — Ambulatory Visit (HOSPITAL_BASED_OUTPATIENT_CLINIC_OR_DEPARTMENT_OTHER): Payer: Medicare Other | Admitting: Physical Medicine & Rehabilitation

## 2013-04-10 VITALS — BP 111/69 | HR 90 | Resp 14 | Ht 70.0 in | Wt 168.0 lb

## 2013-04-10 DIAGNOSIS — G8110 Spastic hemiplegia affecting unspecified side: Secondary | ICD-10-CM

## 2013-04-10 NOTE — Progress Notes (Signed)
Botox Injection for spasticity using needle EMG guidance  Dilution: 50 Units/ml Indication: Severe spasticity which interferes with ADL,mobility and/or  hygiene and is unresponsive to medication management and other conservative care Informed consent was obtained after describing risks and benefits of the procedure with the patient. This includes bleeding, bruising, infection, excessive weakness, or medication side effects. A REMS form is on file and signed. Needle: 30 gauge 25mm needle electrode Number of units per muscle Adductor pollicus 12.5U Dorsal Interossei 2,3,4 12.5 U each  All injections were done after obtaining appropriate EMG activity and after negative drawback for blood. The patient tolerated the procedure well. Post procedure instructions were given. A followup appointment was made.

## 2013-04-10 NOTE — Patient Instructions (Signed)

## 2013-05-09 ENCOUNTER — Ambulatory Visit: Payer: Medicare Other | Admitting: Physical Medicine & Rehabilitation

## 2013-06-10 ENCOUNTER — Telehealth: Payer: Self-pay | Admitting: Physical Medicine & Rehabilitation

## 2013-06-10 NOTE — Telephone Encounter (Signed)
Spoke with wife about Botox.  Says Shawn Wiley only got relief for about 1 week, and now is worse than before.  Has no follow up scheduled, concerned about cost - new insurance requires a $50 copayment.  Wants to know if you have any recommendations about what they should do next.

## 2013-06-10 NOTE — Telephone Encounter (Signed)
Would like to see him in follow up at least one more time then only as needed

## 2013-06-11 NOTE — Telephone Encounter (Signed)
Spoke with patient and wife they are not willing to pay $50 copay to come back for follow up.

## 2013-06-16 ENCOUNTER — Ambulatory Visit: Payer: Medicare Other | Admitting: Physical Medicine & Rehabilitation

## 2013-07-01 ENCOUNTER — Other Ambulatory Visit: Payer: Self-pay

## 2013-07-01 ENCOUNTER — Encounter: Payer: Medicare Other | Attending: Physical Medicine & Rehabilitation

## 2013-07-01 ENCOUNTER — Ambulatory Visit (HOSPITAL_BASED_OUTPATIENT_CLINIC_OR_DEPARTMENT_OTHER): Payer: Medicare Other | Admitting: Physical Medicine & Rehabilitation

## 2013-07-01 ENCOUNTER — Encounter: Payer: Self-pay | Admitting: Physical Medicine & Rehabilitation

## 2013-07-01 VITALS — BP 133/87 | HR 91 | Resp 16 | Ht 70.5 in | Wt 169.0 lb

## 2013-07-01 DIAGNOSIS — K219 Gastro-esophageal reflux disease without esophagitis: Secondary | ICD-10-CM | POA: Insufficient documentation

## 2013-07-01 DIAGNOSIS — G8110 Spastic hemiplegia affecting unspecified side: Secondary | ICD-10-CM

## 2013-07-01 DIAGNOSIS — I10 Essential (primary) hypertension: Secondary | ICD-10-CM | POA: Insufficient documentation

## 2013-07-01 DIAGNOSIS — I252 Old myocardial infarction: Secondary | ICD-10-CM | POA: Insufficient documentation

## 2013-07-01 DIAGNOSIS — I69959 Hemiplegia and hemiparesis following unspecified cerebrovascular disease affecting unspecified side: Secondary | ICD-10-CM | POA: Insufficient documentation

## 2013-07-01 DIAGNOSIS — I2510 Atherosclerotic heart disease of native coronary artery without angina pectoris: Secondary | ICD-10-CM | POA: Insufficient documentation

## 2013-07-01 MED ORDER — TIZANIDINE HCL 2 MG PO TABS: 2.0000 mg | ORAL_TABLET | Freq: Three times a day (TID) | ORAL | Status: DC

## 2013-07-01 NOTE — Progress Notes (Signed)
   Subjective:    Patient ID: Shawn Wiley, male    DOB: 1935-11-03, 78 y.o.   MRN: [id number]  HPI  Pain Inventory Average Pain 7 Pain Right Now 7 My pain is dull  In the last 24 hours, has pain interfered with the following? General activity 7 Relation with others 8 Enjoyment of life 9 What TIME of day is your pain at its worst? daytime Sleep (in general) Poor  Pain is worse with: walking, bending, sitting, inactivity, standing and some activites Pain improves with: pacing activities and medication Relief from Meds: 2  Mobility walk with assistance use a cane  Function I need assistance with the following:  feeding, dressing and household duties  Neuro/Psych weakness trouble walking confusion depression  Prior Studies Any changes since last visit?  no  Physicians involved in your care Any changes since last visit?  no   Family History  Problem Relation Age of Onset  . Stroke Father    History   Social History  . Marital Status: Married    Spouse Name: N/A    Number of Children: N/A  . Years of Education: N/A   Social History Main Topics  . Smoking status: Former Smoker    Quit date: 05/21/2011  . Smokeless tobacco: Former Systems developer    Quit date: 01/01/2010  . Alcohol Use: No  . Drug Use: No  . Sexual Activity: None   Other Topics Concern  . None   Social History Narrative  . None   Past Surgical History  Procedure Laterality Date  . Hernia repair  2012  . Cholecystectomy  2012  . Colon surgery  2008  . Cardiac catheterization  2013   Past Medical History  Diagnosis Date  . Coronary artery disease   . Myocardial infarction   . Stroke   . GERD (gastroesophageal reflux disease)   . Hypertension    BP 133/87  Pulse 91  Resp 16  Ht 5' 10.5" (1.791 m)  Wt 169 lb (76.658 kg)  BMI 23.90 kg/m2  SpO2 95%  Opioid Risk Score:   Fall Risk Score: Moderate Fall Risk (6-13 points) (patient educated handout given)   Review of Systems    Musculoskeletal: Positive for gait problem.  Neurological: Positive for weakness.  Psychiatric/Behavioral: Positive for confusion and dysphoric mood.  All other systems reviewed and are negative.       Objective:   Physical Exam        Assessment & Plan:

## 2013-07-11 ENCOUNTER — Telehealth: Payer: Self-pay

## 2013-07-11 NOTE — Telephone Encounter (Signed)
Joann @ [facility] is requesting last OV notes faxed to [phone]

## 2013-07-14 ENCOUNTER — Telehealth: Payer: Self-pay

## 2013-07-14 NOTE — Telephone Encounter (Signed)
Left message for Shawn Wiley to call back regarding her request for additional info on patient for his orthosis.

## 2013-07-14 NOTE — Telephone Encounter (Signed)
Right wrist hand orthosis to position wrist and fingers in extension. Also maintain slight abduction of fingers

## 2013-07-14 NOTE — Telephone Encounter (Signed)
Note faxed to [phone] for medicare to approve orthosis.

## 2013-07-14 NOTE — Telephone Encounter (Signed)
Shawn Wiley with [facility] called to get more information about left hand orthesis.  She has last office note but it does not tell about the need for the brace.

## 2013-08-05 ENCOUNTER — Encounter: Payer: Medicare Other | Attending: Physical Medicine & Rehabilitation

## 2013-08-05 ENCOUNTER — Encounter: Payer: Self-pay | Admitting: Physical Medicine & Rehabilitation

## 2013-08-05 ENCOUNTER — Ambulatory Visit (HOSPITAL_BASED_OUTPATIENT_CLINIC_OR_DEPARTMENT_OTHER): Payer: Medicare Other | Admitting: Physical Medicine & Rehabilitation

## 2013-08-05 VITALS — BP 132/77 | HR 93 | Resp 16 | Ht 70.0 in | Wt 167.0 lb

## 2013-08-05 DIAGNOSIS — K219 Gastro-esophageal reflux disease without esophagitis: Secondary | ICD-10-CM | POA: Insufficient documentation

## 2013-08-05 DIAGNOSIS — I69959 Hemiplegia and hemiparesis following unspecified cerebrovascular disease affecting unspecified side: Secondary | ICD-10-CM | POA: Insufficient documentation

## 2013-08-05 DIAGNOSIS — I2510 Atherosclerotic heart disease of native coronary artery without angina pectoris: Secondary | ICD-10-CM | POA: Insufficient documentation

## 2013-08-05 DIAGNOSIS — I10 Essential (primary) hypertension: Secondary | ICD-10-CM | POA: Insufficient documentation

## 2013-08-05 DIAGNOSIS — I252 Old myocardial infarction: Secondary | ICD-10-CM | POA: Insufficient documentation

## 2013-08-05 DIAGNOSIS — G8110 Spastic hemiplegia affecting unspecified side: Secondary | ICD-10-CM

## 2013-08-05 MED ORDER — TIZANIDINE HCL 2 MG PO TABS: 2.0000 mg | ORAL_TABLET | Freq: Three times a day (TID) | ORAL | Status: DC

## 2013-08-05 NOTE — Progress Notes (Signed)
  Subjective:    Patient ID: Shawn Wiley, male    DOB: 1935-12-17, 78 y.o.   MRN: [id number]  HPI    Review of Systems     Objective:   Physical Exam        Assessment & Plan:  Botox Injection for spasticity using needle EMG guidance  Dilution: 50 Units/ml Indication: Severe spasticity which interferes with ADL,mobility and/or  hygiene and is unresponsive to medication management and other conservative care Informed consent was obtained after describing risks and benefits of the procedure with the patient. This includes bleeding, bruising, infection, excessive weakness, or medication side effects. A REMS form is on file and signed. Needle: 25 gauge 31mm needle electrode Number of units per muscle LEFT Tibialis posterior 25 x 3=75 Flexor digitorum longus 25x3=75 Flexor hallucis longus 25x3=75 Flexor Hallucis brevis 25x2=50 Medial soleus 25x1=25 Lumbricals 25x4=100 All injections were done after obtaining appropriate EMG activity and after negative drawback for blood. The patient tolerated the procedure well. Post procedure instructions were given. A followup appointment was made.

## 2013-08-05 NOTE — Patient Instructions (Signed)

## 2013-10-06 ENCOUNTER — Ambulatory Visit: Payer: Medicare Other | Admitting: Physical Medicine & Rehabilitation

## 2013-11-03 ENCOUNTER — Telehealth: Payer: Self-pay

## 2013-11-03 NOTE — Telephone Encounter (Signed)
Patient's wife called on his behalf requesting a new RX for left leg brace/shoe. Patient's caliber plate is broken and his sole is coming off his shoe. Patient has a appt next week, but patient's wife is afraid he will fall if he doesn't get a new shoe soon. Fax order to [facility] at [phone].

## 2013-11-04 NOTE — Telephone Encounter (Signed)
Order faxed to [facility].

## 2013-11-04 NOTE — Telephone Encounter (Signed)
Attempted to contact patient's wife/patient to give her a update on the progress of ordering the leg brace for Mr. Shawn Wiley. Left a message on a verified voicemail.

## 2013-11-11 ENCOUNTER — Ambulatory Visit: Payer: Medicare Other | Admitting: Physical Medicine & Rehabilitation

## 2013-11-11 ENCOUNTER — Ambulatory Visit: Payer: Medicare Other

## 2013-11-20 ENCOUNTER — Ambulatory Visit: Payer: Medicare Other

## 2013-11-20 ENCOUNTER — Ambulatory Visit: Payer: Medicare Other | Admitting: Physical Medicine & Rehabilitation

## 2014-02-21 IMAGING — CR DG SHOULDER 2+V*L*
3 series · 3 of 3 positions shown · non-contrast
Comparison: None.

CLINICAL DATA: Shoulder pain

LEFT SHOULDER - 2+ VIEW

[t shoulder ap internal left]
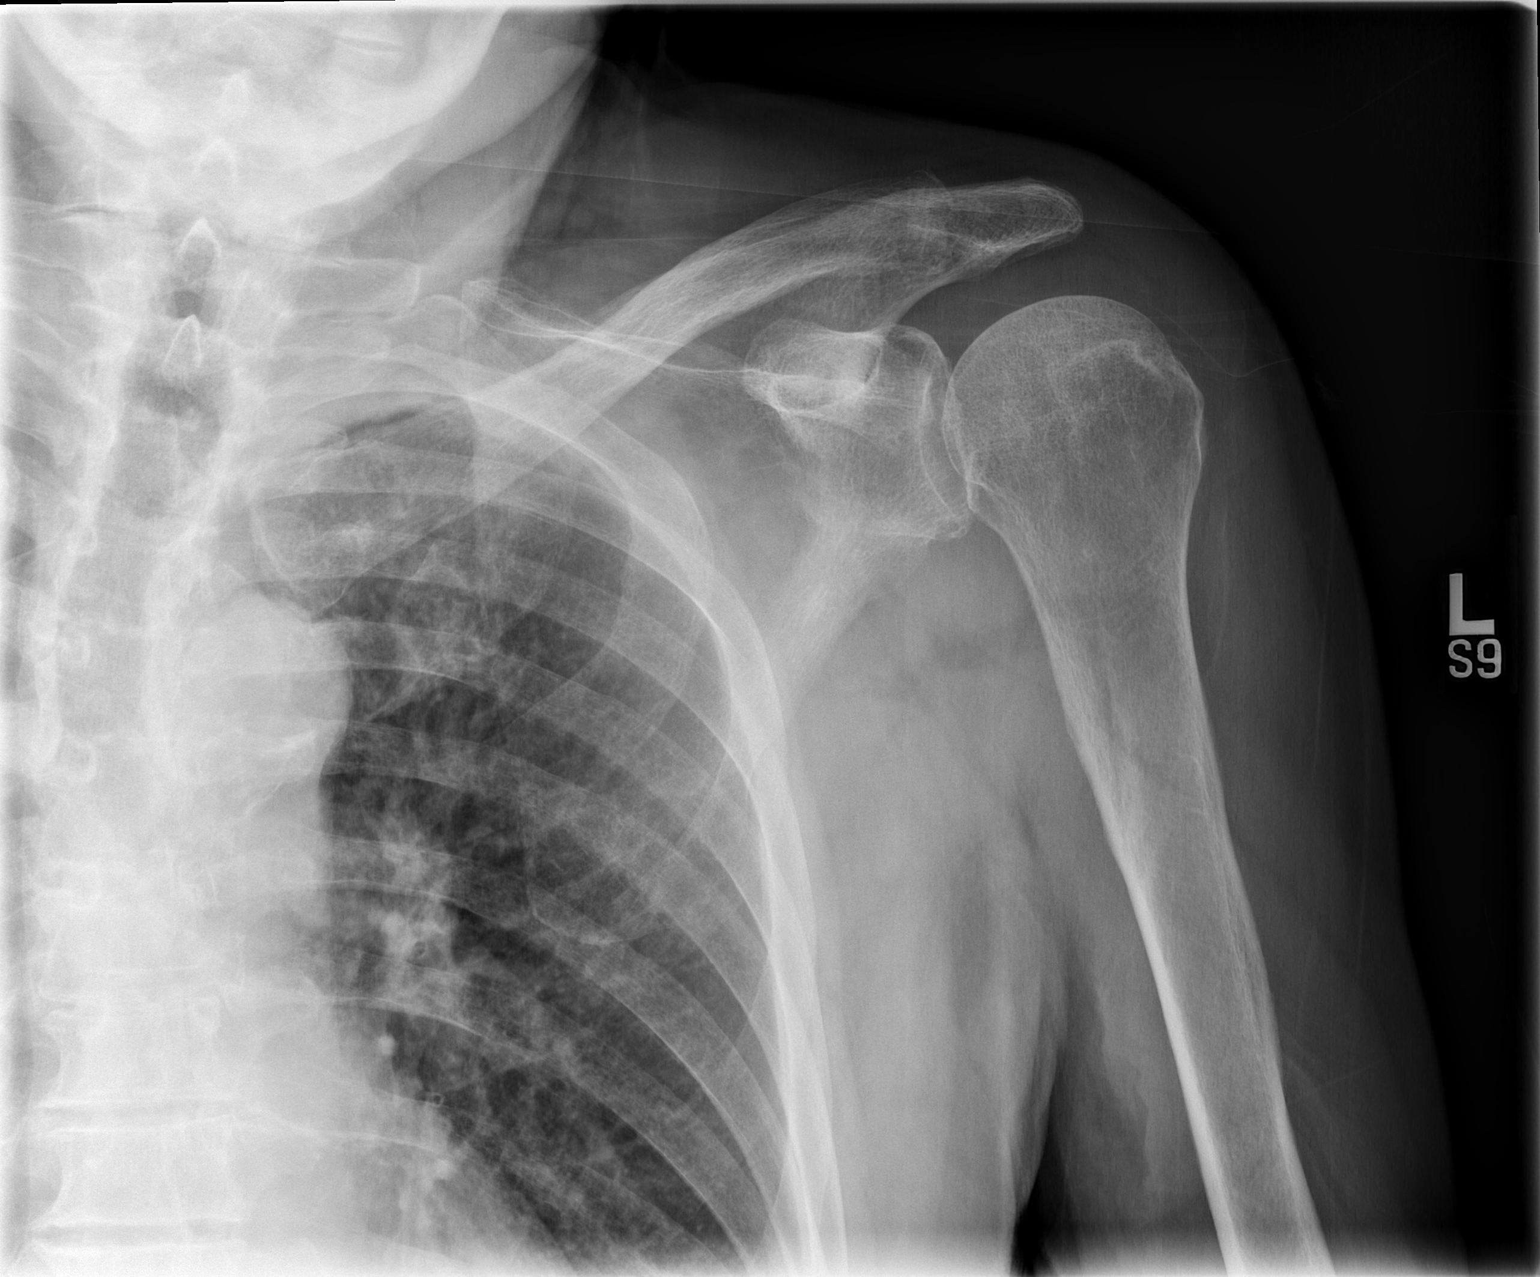

[t shoulder ap external left]
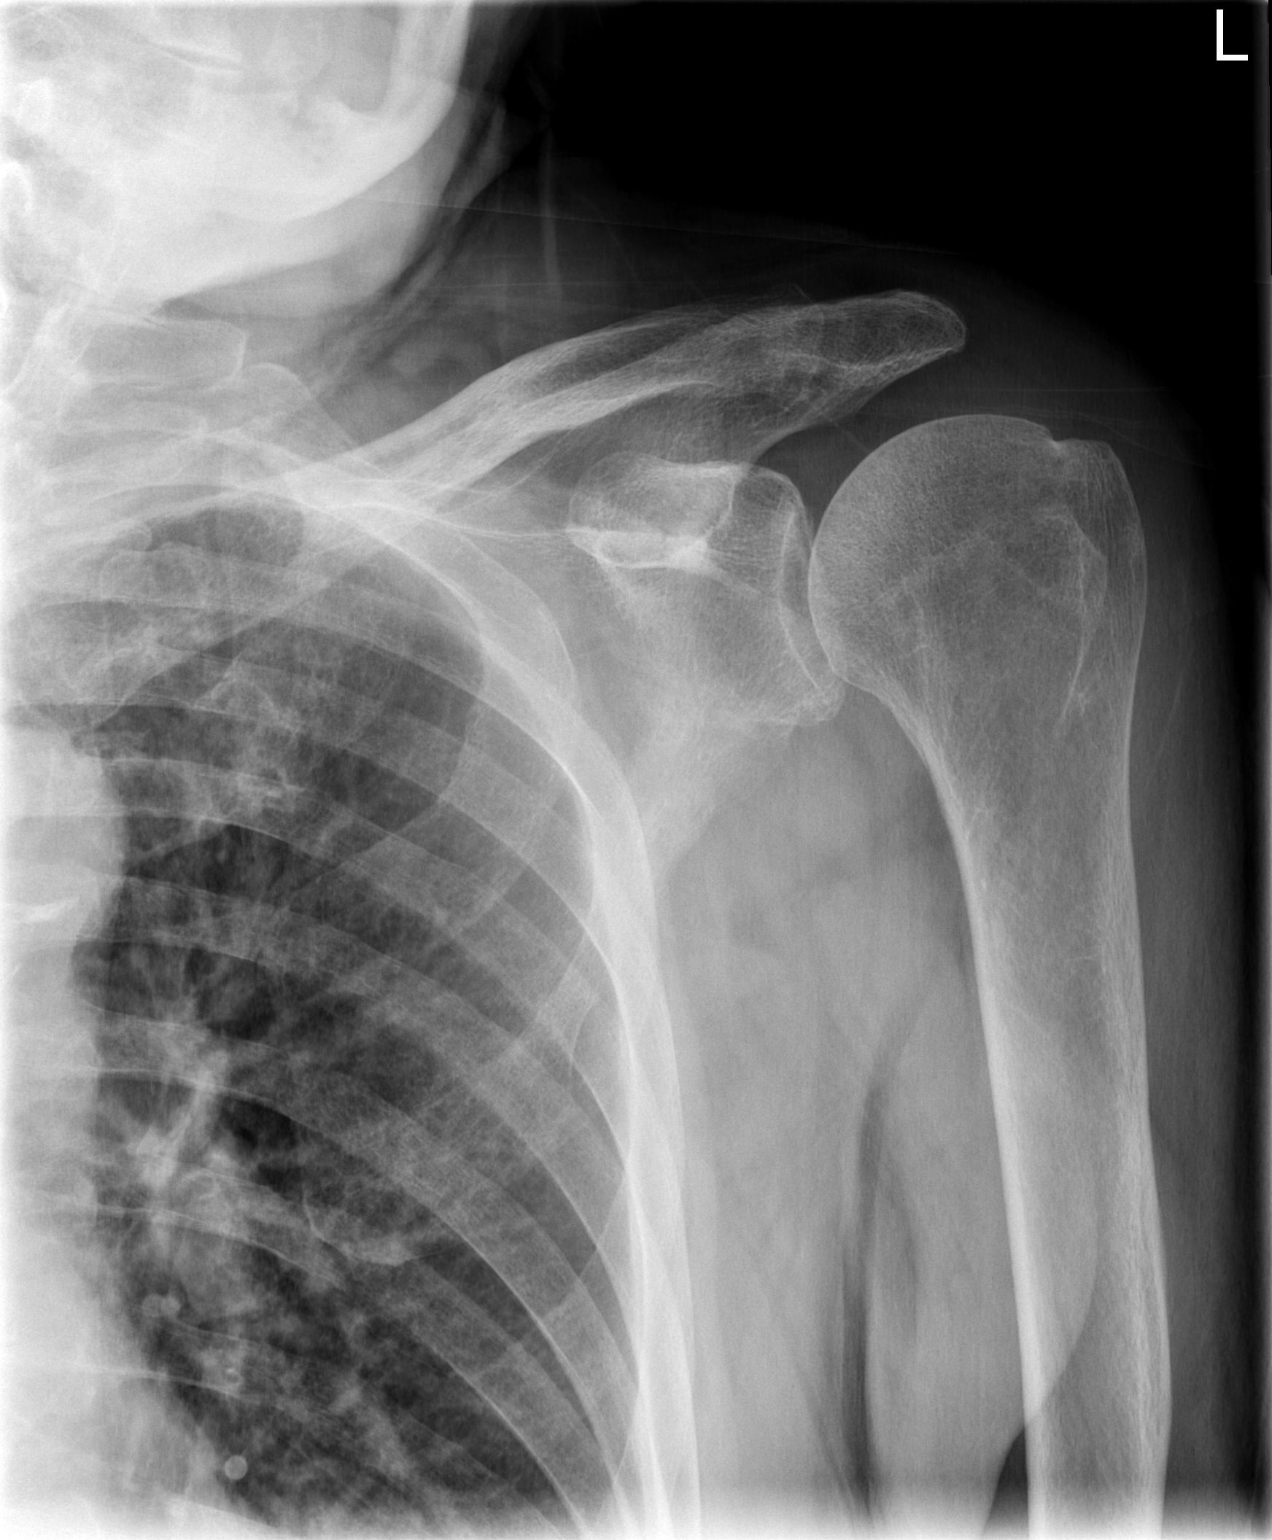

[t shoulder y view left]
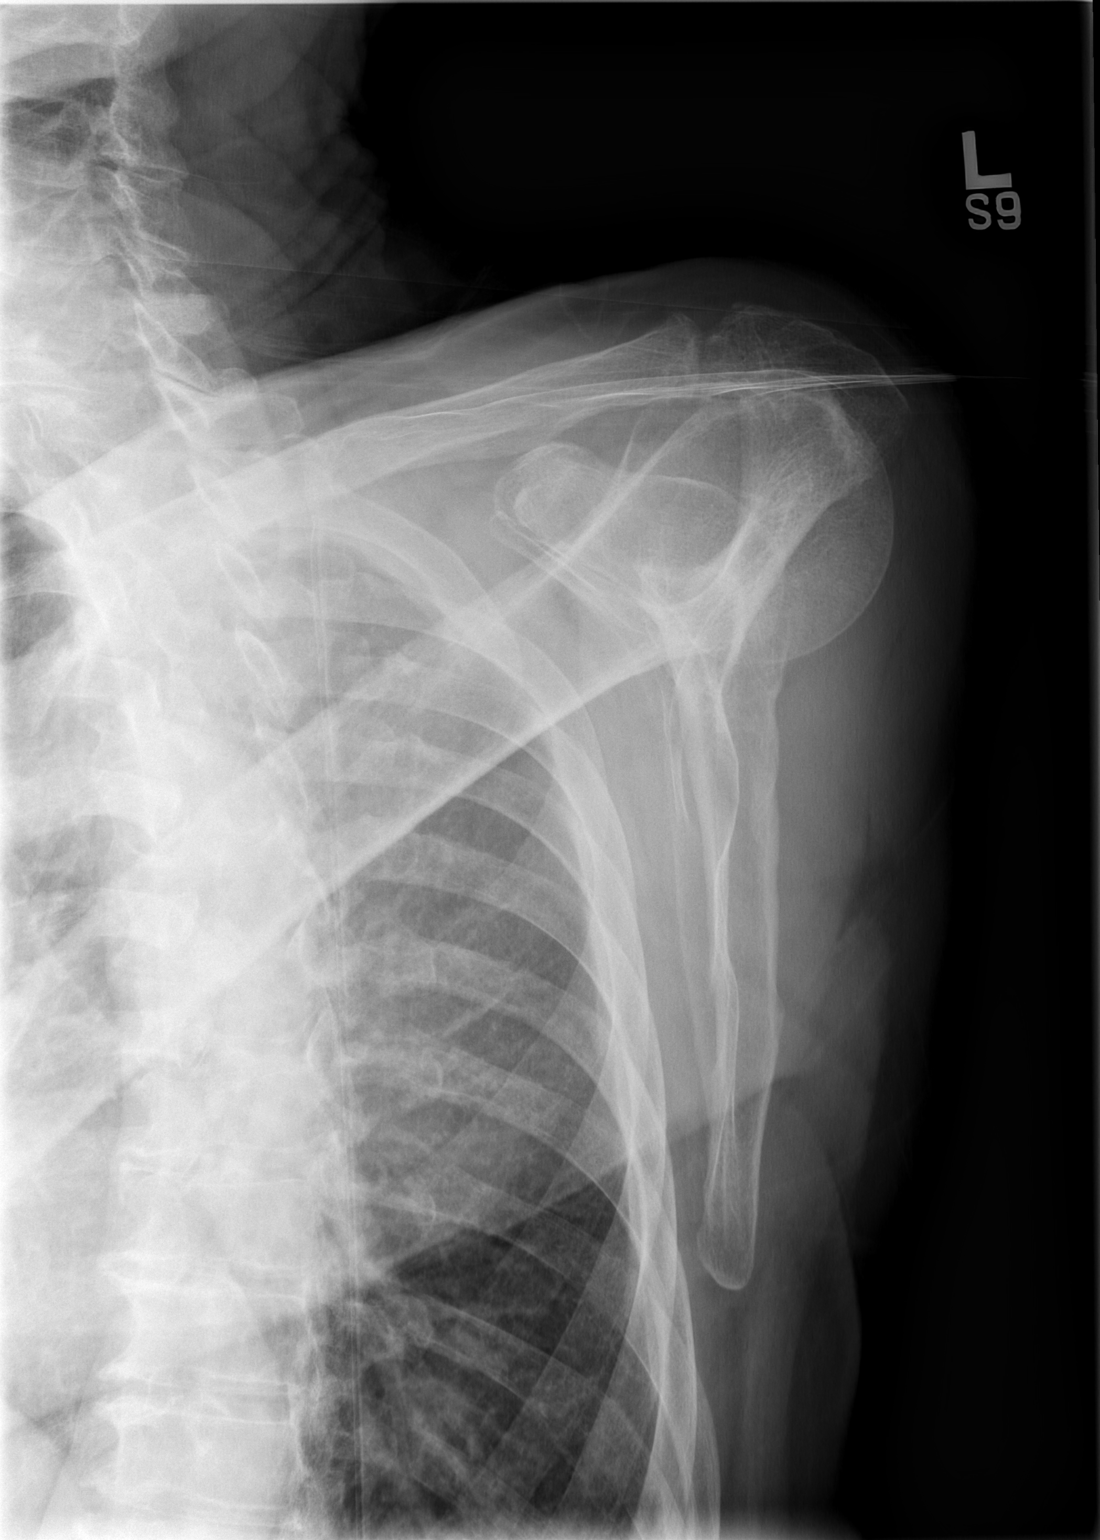

[3 of 3 positions shown; findings below may reference images not displayed]

FINDINGS: No acute fracture.  No dislocation.  Unremarkable soft
tissues.
IMPRESSION: No acute bony pathology.

## 2014-02-21 IMAGING — CR DG SHOULDER 2+V*R*
3 series · 3 of 3 positions shown · non-contrast
Comparison: None.

CLINICAL DATA: Shoulder pain.  Stroke.

RIGHT SHOULDER - 2+ VIEW

[t shoulder ap internal right]
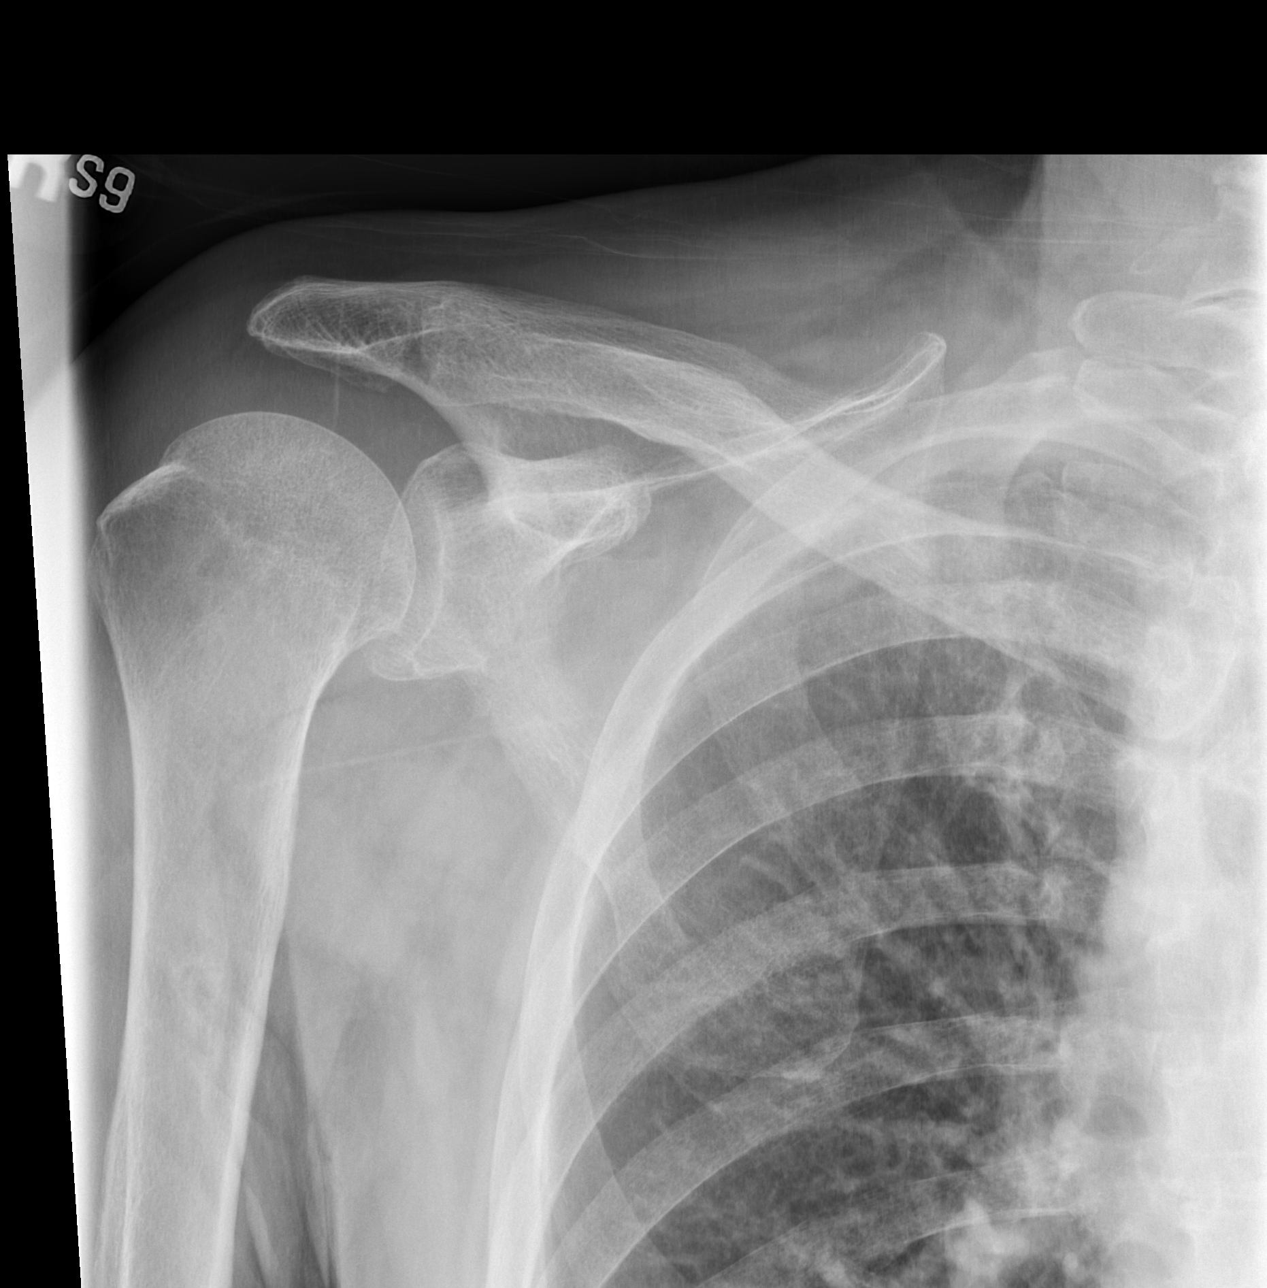

[t shoulder ap external righ]
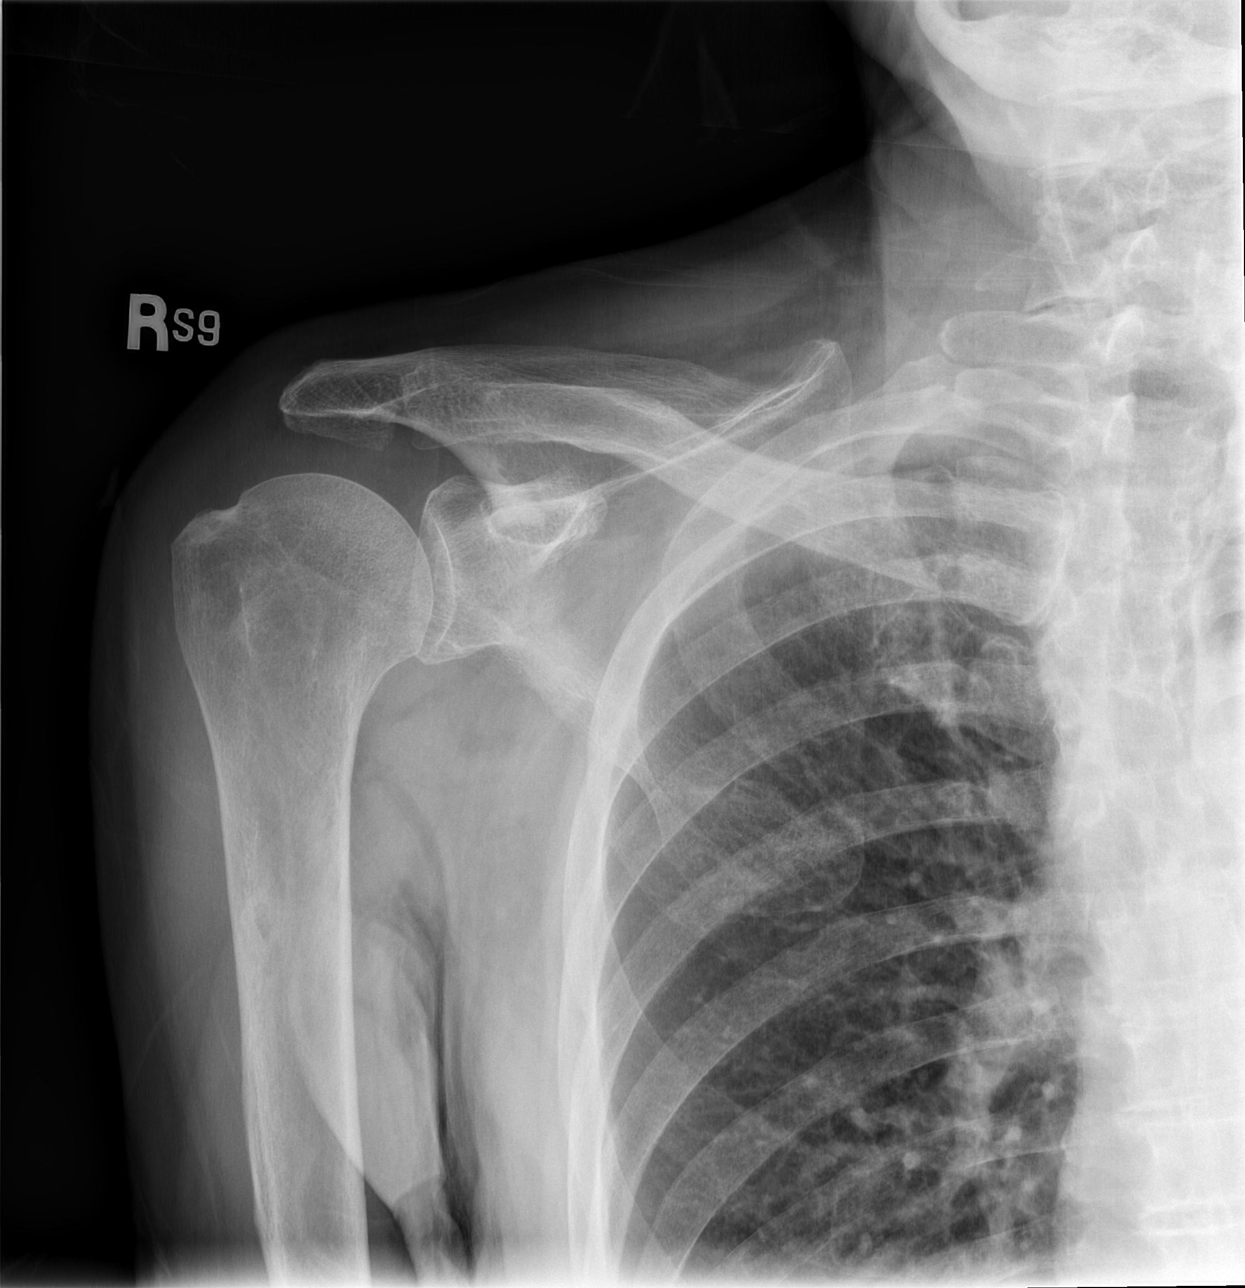

[t shoulder y view right]
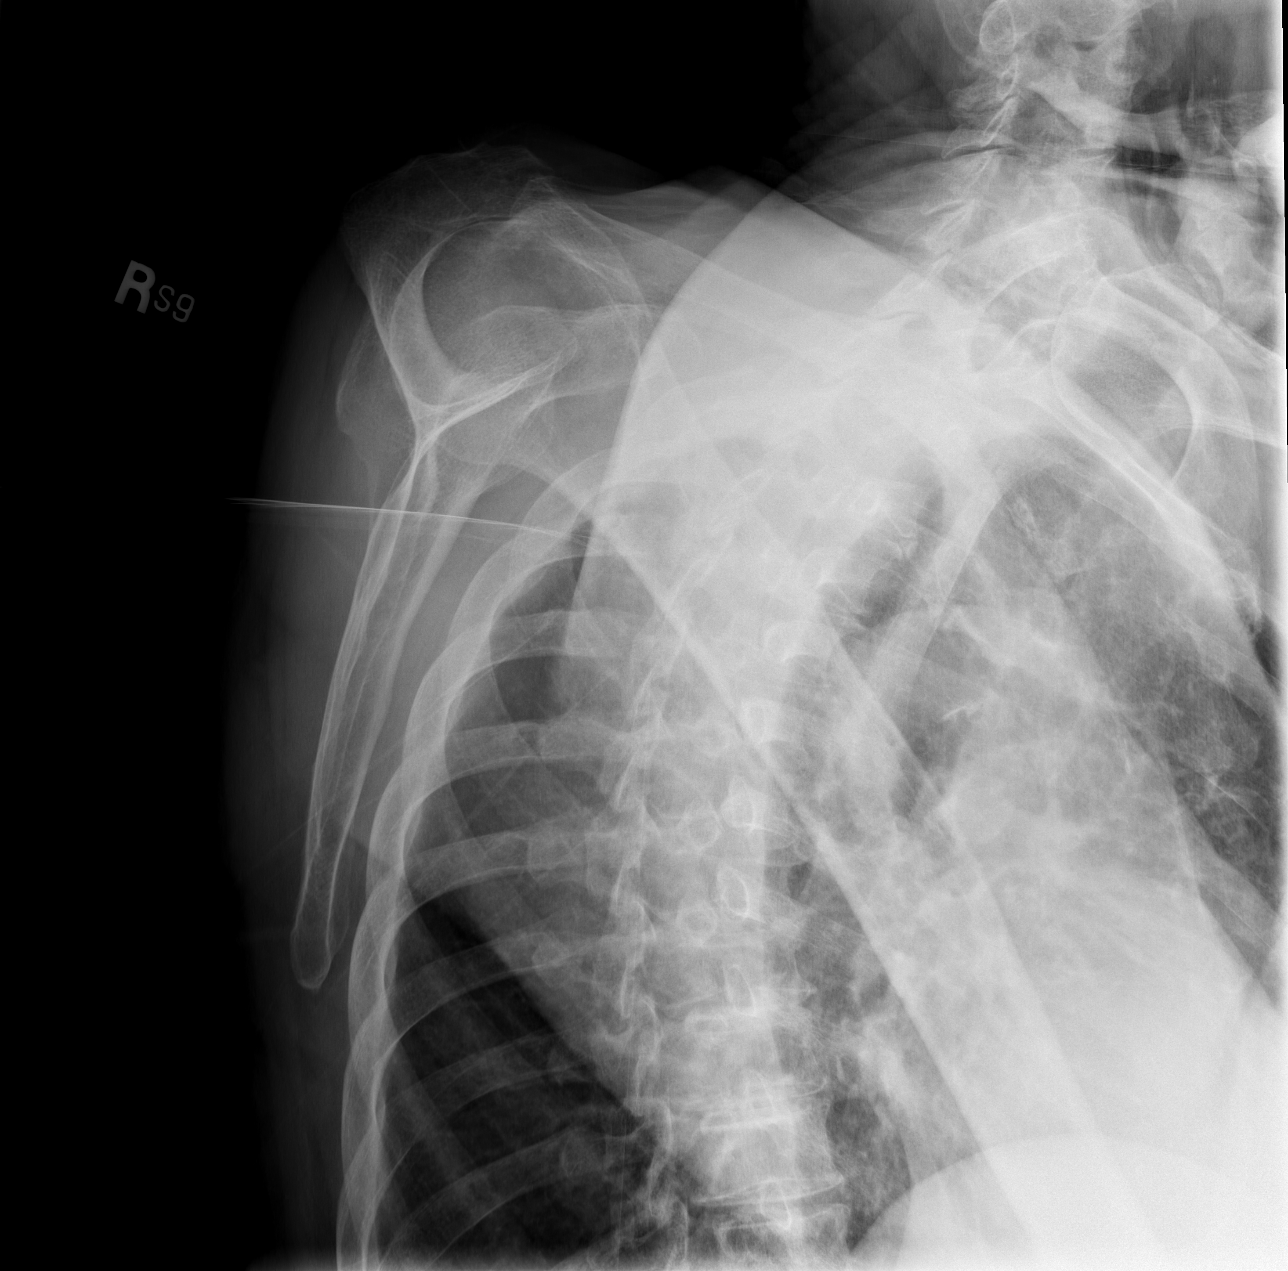

[3 of 3 positions shown; findings below may reference images not displayed]

FINDINGS: No acute fracture and no dislocation.  Unremarkable soft
tissues.
IMPRESSION: No acute bony pathology.

## 2016-05-01 DIAGNOSIS — S32415A Nondisplaced fracture of anterior wall of left acetabulum, initial encounter for closed fracture: Secondary | ICD-10-CM | POA: Diagnosis not present

## 2016-05-01 DIAGNOSIS — N400 Enlarged prostate without lower urinary tract symptoms: Secondary | ICD-10-CM | POA: Diagnosis not present

## 2016-05-01 DIAGNOSIS — R296 Repeated falls: Secondary | ICD-10-CM | POA: Diagnosis not present

## 2016-05-01 DIAGNOSIS — J449 Chronic obstructive pulmonary disease, unspecified: Secondary | ICD-10-CM | POA: Diagnosis not present

## 2016-05-01 DIAGNOSIS — S72002D Fracture of unspecified part of neck of left femur, subsequent encounter for closed fracture with routine healing: Secondary | ICD-10-CM | POA: Diagnosis not present

## 2016-05-01 DIAGNOSIS — S32415D Nondisplaced fracture of anterior wall of left acetabulum, subsequent encounter for fracture with routine healing: Secondary | ICD-10-CM | POA: Diagnosis not present

## 2016-05-01 DIAGNOSIS — Z9181 History of falling: Secondary | ICD-10-CM | POA: Diagnosis not present

## 2016-05-04 DIAGNOSIS — S32415A Nondisplaced fracture of anterior wall of left acetabulum, initial encounter for closed fracture: Secondary | ICD-10-CM | POA: Diagnosis not present

## 2016-05-23 DIAGNOSIS — F329 Major depressive disorder, single episode, unspecified: Secondary | ICD-10-CM | POA: Diagnosis not present

## 2016-05-23 DIAGNOSIS — I10 Essential (primary) hypertension: Secondary | ICD-10-CM | POA: Diagnosis not present

## 2016-05-23 DIAGNOSIS — S32402A Unspecified fracture of left acetabulum, initial encounter for closed fracture: Secondary | ICD-10-CM | POA: Diagnosis not present

## 2016-05-23 DIAGNOSIS — Z125 Encounter for screening for malignant neoplasm of prostate: Secondary | ICD-10-CM | POA: Diagnosis not present

## 2016-05-23 DIAGNOSIS — E871 Hypo-osmolality and hyponatremia: Secondary | ICD-10-CM | POA: Diagnosis not present

## 2016-05-23 DIAGNOSIS — Z1389 Encounter for screening for other disorder: Secondary | ICD-10-CM | POA: Diagnosis not present

## 2016-05-30 DIAGNOSIS — S32415A Nondisplaced fracture of anterior wall of left acetabulum, initial encounter for closed fracture: Secondary | ICD-10-CM | POA: Diagnosis not present

## 2016-06-27 DIAGNOSIS — S32415A Nondisplaced fracture of anterior wall of left acetabulum, initial encounter for closed fracture: Secondary | ICD-10-CM | POA: Diagnosis not present

## 2016-06-28 DIAGNOSIS — M7581 Other shoulder lesions, right shoulder: Secondary | ICD-10-CM | POA: Diagnosis not present

## 2016-07-05 DIAGNOSIS — Z23 Encounter for immunization: Secondary | ICD-10-CM | POA: Diagnosis not present

## 2016-07-05 DIAGNOSIS — Z6822 Body mass index (BMI) 22.0-22.9, adult: Secondary | ICD-10-CM | POA: Diagnosis not present

## 2016-07-05 DIAGNOSIS — I10 Essential (primary) hypertension: Secondary | ICD-10-CM | POA: Diagnosis not present

## 2016-08-17 DIAGNOSIS — J309 Allergic rhinitis, unspecified: Secondary | ICD-10-CM | POA: Diagnosis not present

## 2016-08-17 DIAGNOSIS — Z6823 Body mass index (BMI) 23.0-23.9, adult: Secondary | ICD-10-CM | POA: Diagnosis not present

## 2016-09-26 DIAGNOSIS — L570 Actinic keratosis: Secondary | ICD-10-CM | POA: Diagnosis not present

## 2016-09-26 DIAGNOSIS — L820 Inflamed seborrheic keratosis: Secondary | ICD-10-CM | POA: Diagnosis not present

## 2016-12-07 DIAGNOSIS — M47818 Spondylosis without myelopathy or radiculopathy, sacral and sacrococcygeal region: Secondary | ICD-10-CM | POA: Diagnosis not present

## 2016-12-07 DIAGNOSIS — M9905 Segmental and somatic dysfunction of pelvic region: Secondary | ICD-10-CM | POA: Diagnosis not present

## 2016-12-07 DIAGNOSIS — M9904 Segmental and somatic dysfunction of sacral region: Secondary | ICD-10-CM | POA: Diagnosis not present

## 2016-12-07 DIAGNOSIS — M9903 Segmental and somatic dysfunction of lumbar region: Secondary | ICD-10-CM | POA: Diagnosis not present

## 2016-12-07 DIAGNOSIS — M5388 Other specified dorsopathies, sacral and sacrococcygeal region: Secondary | ICD-10-CM | POA: Diagnosis not present

## 2016-12-07 DIAGNOSIS — M4306 Spondylolysis, lumbar region: Secondary | ICD-10-CM | POA: Diagnosis not present

## 2017-01-18 DIAGNOSIS — Z136 Encounter for screening for cardiovascular disorders: Secondary | ICD-10-CM | POA: Diagnosis not present

## 2017-01-18 DIAGNOSIS — E785 Hyperlipidemia, unspecified: Secondary | ICD-10-CM | POA: Diagnosis not present

## 2017-01-18 DIAGNOSIS — Z1389 Encounter for screening for other disorder: Secondary | ICD-10-CM | POA: Diagnosis not present

## 2017-01-18 DIAGNOSIS — Z9181 History of falling: Secondary | ICD-10-CM | POA: Diagnosis not present

## 2017-01-18 DIAGNOSIS — Z125 Encounter for screening for malignant neoplasm of prostate: Secondary | ICD-10-CM | POA: Diagnosis not present

## 2017-01-18 DIAGNOSIS — Z0000 Encounter for general adult medical examination without abnormal findings: Secondary | ICD-10-CM | POA: Diagnosis not present

## 2017-01-22 DIAGNOSIS — M7581 Other shoulder lesions, right shoulder: Secondary | ICD-10-CM | POA: Diagnosis not present

## 2017-03-29 DIAGNOSIS — L570 Actinic keratosis: Secondary | ICD-10-CM | POA: Diagnosis not present

## 2017-03-29 DIAGNOSIS — L821 Other seborrheic keratosis: Secondary | ICD-10-CM | POA: Diagnosis not present

## 2017-03-29 DIAGNOSIS — L578 Other skin changes due to chronic exposure to nonionizing radiation: Secondary | ICD-10-CM | POA: Diagnosis not present

## 2017-07-16 DIAGNOSIS — J309 Allergic rhinitis, unspecified: Secondary | ICD-10-CM | POA: Diagnosis not present

## 2017-09-26 DIAGNOSIS — L578 Other skin changes due to chronic exposure to nonionizing radiation: Secondary | ICD-10-CM | POA: Diagnosis not present

## 2017-09-26 DIAGNOSIS — L570 Actinic keratosis: Secondary | ICD-10-CM | POA: Diagnosis not present

## 2017-09-26 DIAGNOSIS — L821 Other seborrheic keratosis: Secondary | ICD-10-CM | POA: Diagnosis not present

## 2017-09-26 DIAGNOSIS — L820 Inflamed seborrheic keratosis: Secondary | ICD-10-CM | POA: Diagnosis not present

## 2017-11-29 DIAGNOSIS — M62838 Other muscle spasm: Secondary | ICD-10-CM | POA: Diagnosis not present

## 2017-12-24 DIAGNOSIS — M7062 Trochanteric bursitis, left hip: Secondary | ICD-10-CM | POA: Diagnosis not present

## 2018-01-16 DIAGNOSIS — Z139 Encounter for screening, unspecified: Secondary | ICD-10-CM | POA: Diagnosis not present

## 2018-01-16 DIAGNOSIS — Z1339 Encounter for screening examination for other mental health and behavioral disorders: Secondary | ICD-10-CM | POA: Diagnosis not present

## 2018-01-16 DIAGNOSIS — J302 Other seasonal allergic rhinitis: Secondary | ICD-10-CM | POA: Diagnosis not present

## 2018-03-26 DIAGNOSIS — L570 Actinic keratosis: Secondary | ICD-10-CM | POA: Diagnosis not present

## 2018-03-26 DIAGNOSIS — L821 Other seborrheic keratosis: Secondary | ICD-10-CM | POA: Diagnosis not present

## 2018-03-26 DIAGNOSIS — L578 Other skin changes due to chronic exposure to nonionizing radiation: Secondary | ICD-10-CM | POA: Diagnosis not present

## 2018-05-30 DIAGNOSIS — J302 Other seasonal allergic rhinitis: Secondary | ICD-10-CM | POA: Diagnosis not present

## 2019-01-02 DIAGNOSIS — M79642 Pain in left hand: Secondary | ICD-10-CM | POA: Diagnosis not present

## 2019-01-02 DIAGNOSIS — S61002A Unspecified open wound of left thumb without damage to nail, initial encounter: Secondary | ICD-10-CM | POA: Diagnosis not present

## 2019-01-03 DIAGNOSIS — I10 Essential (primary) hypertension: Secondary | ICD-10-CM | POA: Diagnosis not present

## 2019-01-03 DIAGNOSIS — I6389 Other cerebral infarction: Secondary | ICD-10-CM | POA: Diagnosis not present

## 2019-01-03 DIAGNOSIS — L98492 Non-pressure chronic ulcer of skin of other sites with fat layer exposed: Secondary | ICD-10-CM | POA: Diagnosis not present

## 2019-01-03 DIAGNOSIS — M24549 Contracture, unspecified hand: Secondary | ICD-10-CM | POA: Diagnosis not present

## 2019-01-10 DIAGNOSIS — I10 Essential (primary) hypertension: Secondary | ICD-10-CM | POA: Diagnosis not present

## 2019-01-10 DIAGNOSIS — M24549 Contracture, unspecified hand: Secondary | ICD-10-CM | POA: Diagnosis not present

## 2019-01-10 DIAGNOSIS — L89893 Pressure ulcer of other site, stage 3: Secondary | ICD-10-CM | POA: Diagnosis not present

## 2019-01-10 DIAGNOSIS — S61002A Unspecified open wound of left thumb without damage to nail, initial encounter: Secondary | ICD-10-CM | POA: Diagnosis not present

## 2019-01-10 DIAGNOSIS — I6389 Other cerebral infarction: Secondary | ICD-10-CM | POA: Diagnosis not present

## 2019-01-10 DIAGNOSIS — L98492 Non-pressure chronic ulcer of skin of other sites with fat layer exposed: Secondary | ICD-10-CM | POA: Diagnosis not present

## 2019-01-15 DIAGNOSIS — Z01812 Encounter for preprocedural laboratory examination: Secondary | ICD-10-CM | POA: Diagnosis not present

## 2019-01-15 DIAGNOSIS — M24542 Contracture, left hand: Secondary | ICD-10-CM | POA: Diagnosis not present

## 2019-01-15 DIAGNOSIS — Z20828 Contact with and (suspected) exposure to other viral communicable diseases: Secondary | ICD-10-CM | POA: Diagnosis not present

## 2019-01-21 DIAGNOSIS — G8918 Other acute postprocedural pain: Secondary | ICD-10-CM | POA: Diagnosis not present

## 2019-01-21 DIAGNOSIS — M24522 Contracture, left elbow: Secondary | ICD-10-CM | POA: Diagnosis not present

## 2019-01-21 DIAGNOSIS — M24542 Contracture, left hand: Secondary | ICD-10-CM | POA: Diagnosis not present

## 2019-01-21 DIAGNOSIS — M24532 Contracture, left wrist: Secondary | ICD-10-CM | POA: Diagnosis not present

## 2019-01-21 DIAGNOSIS — S61002A Unspecified open wound of left thumb without damage to nail, initial encounter: Secondary | ICD-10-CM | POA: Diagnosis not present

## 2019-01-24 DIAGNOSIS — M24542 Contracture, left hand: Secondary | ICD-10-CM | POA: Diagnosis not present

## 2019-01-24 DIAGNOSIS — M25512 Pain in left shoulder: Secondary | ICD-10-CM | POA: Diagnosis not present

## 2019-01-31 DIAGNOSIS — M79602 Pain in left arm: Secondary | ICD-10-CM | POA: Diagnosis not present

## 2019-02-05 DIAGNOSIS — L98492 Non-pressure chronic ulcer of skin of other sites with fat layer exposed: Secondary | ICD-10-CM | POA: Diagnosis not present

## 2019-02-05 DIAGNOSIS — L89893 Pressure ulcer of other site, stage 3: Secondary | ICD-10-CM | POA: Diagnosis not present

## 2019-02-05 DIAGNOSIS — M24542 Contracture, left hand: Secondary | ICD-10-CM | POA: Diagnosis not present

## 2019-02-06 DIAGNOSIS — L98492 Non-pressure chronic ulcer of skin of other sites with fat layer exposed: Secondary | ICD-10-CM | POA: Diagnosis not present

## 2019-02-10 DIAGNOSIS — R29898 Other symptoms and signs involving the musculoskeletal system: Secondary | ICD-10-CM | POA: Diagnosis not present

## 2019-02-10 DIAGNOSIS — M24542 Contracture, left hand: Secondary | ICD-10-CM | POA: Diagnosis not present

## 2019-02-19 DIAGNOSIS — I10 Essential (primary) hypertension: Secondary | ICD-10-CM | POA: Diagnosis not present

## 2019-02-19 DIAGNOSIS — I6389 Other cerebral infarction: Secondary | ICD-10-CM | POA: Diagnosis not present

## 2019-02-19 DIAGNOSIS — S61002A Unspecified open wound of left thumb without damage to nail, initial encounter: Secondary | ICD-10-CM | POA: Diagnosis not present

## 2019-02-19 DIAGNOSIS — M24542 Contracture, left hand: Secondary | ICD-10-CM | POA: Diagnosis not present

## 2019-02-19 DIAGNOSIS — Z87891 Personal history of nicotine dependence: Secondary | ICD-10-CM | POA: Diagnosis not present

## 2019-02-19 DIAGNOSIS — L98492 Non-pressure chronic ulcer of skin of other sites with fat layer exposed: Secondary | ICD-10-CM | POA: Diagnosis not present

## 2019-02-20 DIAGNOSIS — Z23 Encounter for immunization: Secondary | ICD-10-CM | POA: Diagnosis not present

## 2019-02-21 DIAGNOSIS — M24542 Contracture, left hand: Secondary | ICD-10-CM | POA: Diagnosis not present

## 2019-02-24 DIAGNOSIS — Z0000 Encounter for general adult medical examination without abnormal findings: Secondary | ICD-10-CM | POA: Diagnosis not present

## 2019-02-24 DIAGNOSIS — Z9181 History of falling: Secondary | ICD-10-CM | POA: Diagnosis not present

## 2019-02-24 DIAGNOSIS — Z1331 Encounter for screening for depression: Secondary | ICD-10-CM | POA: Diagnosis not present

## 2019-02-24 DIAGNOSIS — E785 Hyperlipidemia, unspecified: Secondary | ICD-10-CM | POA: Diagnosis not present

## 2019-03-01 DIAGNOSIS — I10 Essential (primary) hypertension: Secondary | ICD-10-CM | POA: Diagnosis not present

## 2019-03-01 DIAGNOSIS — Z0000 Encounter for general adult medical examination without abnormal findings: Secondary | ICD-10-CM | POA: Diagnosis not present

## 2019-03-01 DIAGNOSIS — M159 Polyosteoarthritis, unspecified: Secondary | ICD-10-CM | POA: Diagnosis not present

## 2019-03-06 DIAGNOSIS — M24542 Contracture, left hand: Secondary | ICD-10-CM | POA: Diagnosis not present

## 2019-03-06 DIAGNOSIS — R29898 Other symptoms and signs involving the musculoskeletal system: Secondary | ICD-10-CM | POA: Diagnosis not present

## 2019-04-18 DIAGNOSIS — R29898 Other symptoms and signs involving the musculoskeletal system: Secondary | ICD-10-CM | POA: Diagnosis not present

## 2019-04-18 DIAGNOSIS — M2450 Contracture, unspecified joint: Secondary | ICD-10-CM | POA: Diagnosis not present

## 2019-04-18 DIAGNOSIS — M24542 Contracture, left hand: Secondary | ICD-10-CM | POA: Diagnosis not present

## 2019-04-18 DIAGNOSIS — M629 Disorder of muscle, unspecified: Secondary | ICD-10-CM | POA: Diagnosis not present

## 2020-05-26 DIAGNOSIS — K529 Noninfective gastroenteritis and colitis, unspecified: Secondary | ICD-10-CM | POA: Diagnosis not present

## 2020-10-06 DIAGNOSIS — I10 Essential (primary) hypertension: Secondary | ICD-10-CM | POA: Diagnosis not present

## 2020-11-18 DIAGNOSIS — J302 Other seasonal allergic rhinitis: Secondary | ICD-10-CM | POA: Diagnosis not present

## 2021-03-12 DIAGNOSIS — R300 Dysuria: Secondary | ICD-10-CM | POA: Diagnosis not present

## 2021-03-12 DIAGNOSIS — N3001 Acute cystitis with hematuria: Secondary | ICD-10-CM | POA: Diagnosis not present

## 2021-03-12 DIAGNOSIS — N3090 Cystitis, unspecified without hematuria: Secondary | ICD-10-CM | POA: Diagnosis not present

## 2021-04-13 DIAGNOSIS — Z0000 Encounter for general adult medical examination without abnormal findings: Secondary | ICD-10-CM | POA: Diagnosis not present

## 2021-04-13 DIAGNOSIS — Z139 Encounter for screening, unspecified: Secondary | ICD-10-CM | POA: Diagnosis not present

## 2021-04-13 DIAGNOSIS — E785 Hyperlipidemia, unspecified: Secondary | ICD-10-CM | POA: Diagnosis not present

## 2021-04-13 DIAGNOSIS — Z1331 Encounter for screening for depression: Secondary | ICD-10-CM | POA: Diagnosis not present

## 2021-04-13 DIAGNOSIS — Z9181 History of falling: Secondary | ICD-10-CM | POA: Diagnosis not present

## 2021-04-14 DIAGNOSIS — I10 Essential (primary) hypertension: Secondary | ICD-10-CM | POA: Diagnosis not present

## 2021-04-14 DIAGNOSIS — J069 Acute upper respiratory infection, unspecified: Secondary | ICD-10-CM | POA: Diagnosis not present

## 2021-05-09 DIAGNOSIS — N390 Urinary tract infection, site not specified: Secondary | ICD-10-CM | POA: Diagnosis not present

## 2021-05-10 DIAGNOSIS — I69954 Hemiplegia and hemiparesis following unspecified cerebrovascular disease affecting left non-dominant side: Secondary | ICD-10-CM | POA: Diagnosis not present

## 2021-05-10 DIAGNOSIS — N170 Acute kidney failure with tubular necrosis: Secondary | ICD-10-CM | POA: Diagnosis not present

## 2021-05-10 DIAGNOSIS — A4150 Gram-negative sepsis, unspecified: Secondary | ICD-10-CM | POA: Diagnosis not present

## 2021-05-10 DIAGNOSIS — I959 Hypotension, unspecified: Secondary | ICD-10-CM | POA: Diagnosis not present

## 2021-05-10 DIAGNOSIS — J9601 Acute respiratory failure with hypoxia: Secondary | ICD-10-CM | POA: Diagnosis not present

## 2021-05-10 DIAGNOSIS — R509 Fever, unspecified: Secondary | ICD-10-CM | POA: Diagnosis not present

## 2021-05-10 DIAGNOSIS — R0603 Acute respiratory distress: Secondary | ICD-10-CM | POA: Diagnosis not present

## 2021-05-10 DIAGNOSIS — N179 Acute kidney failure, unspecified: Secondary | ICD-10-CM | POA: Diagnosis not present

## 2021-05-10 DIAGNOSIS — A4151 Sepsis due to Escherichia coli [E. coli]: Secondary | ICD-10-CM | POA: Diagnosis not present

## 2021-05-10 DIAGNOSIS — J449 Chronic obstructive pulmonary disease, unspecified: Secondary | ICD-10-CM | POA: Diagnosis not present

## 2021-05-10 DIAGNOSIS — R918 Other nonspecific abnormal finding of lung field: Secondary | ICD-10-CM | POA: Diagnosis not present

## 2021-05-10 DIAGNOSIS — R042 Hemoptysis: Secondary | ICD-10-CM | POA: Diagnosis not present

## 2021-05-10 DIAGNOSIS — Z9981 Dependence on supplemental oxygen: Secondary | ICD-10-CM | POA: Diagnosis not present

## 2021-05-10 DIAGNOSIS — G9341 Metabolic encephalopathy: Secondary | ICD-10-CM | POA: Diagnosis not present

## 2021-05-10 DIAGNOSIS — J189 Pneumonia, unspecified organism: Secondary | ICD-10-CM | POA: Diagnosis not present

## 2021-05-10 DIAGNOSIS — J90 Pleural effusion, not elsewhere classified: Secondary | ICD-10-CM | POA: Diagnosis not present

## 2021-05-10 DIAGNOSIS — J8401 Alveolar proteinosis: Secondary | ICD-10-CM | POA: Diagnosis not present

## 2021-05-10 DIAGNOSIS — R0902 Hypoxemia: Secondary | ICD-10-CM | POA: Diagnosis not present

## 2021-05-10 DIAGNOSIS — R001 Bradycardia, unspecified: Secondary | ICD-10-CM | POA: Diagnosis not present

## 2021-05-10 DIAGNOSIS — R531 Weakness: Secondary | ICD-10-CM | POA: Diagnosis not present

## 2021-05-10 DIAGNOSIS — E871 Hypo-osmolality and hyponatremia: Secondary | ICD-10-CM | POA: Diagnosis not present

## 2021-05-10 DIAGNOSIS — N390 Urinary tract infection, site not specified: Secondary | ICD-10-CM | POA: Diagnosis not present

## 2021-05-10 DIAGNOSIS — E860 Dehydration: Secondary | ICD-10-CM | POA: Diagnosis not present

## 2021-05-10 DIAGNOSIS — I700 Atherosclerosis of aorta: Secondary | ICD-10-CM | POA: Diagnosis not present

## 2021-05-10 DIAGNOSIS — R112 Nausea with vomiting, unspecified: Secondary | ICD-10-CM | POA: Diagnosis not present

## 2021-05-13 DIAGNOSIS — R509 Fever, unspecified: Secondary | ICD-10-CM | POA: Diagnosis not present

## 2021-05-13 DIAGNOSIS — J189 Pneumonia, unspecified organism: Secondary | ICD-10-CM | POA: Diagnosis not present

## 2021-05-13 DIAGNOSIS — R0902 Hypoxemia: Secondary | ICD-10-CM | POA: Diagnosis not present

## 2021-05-15 DIAGNOSIS — J90 Pleural effusion, not elsewhere classified: Secondary | ICD-10-CM | POA: Diagnosis not present

## 2021-05-15 DIAGNOSIS — J189 Pneumonia, unspecified organism: Secondary | ICD-10-CM | POA: Diagnosis not present

## 2021-05-15 DIAGNOSIS — I700 Atherosclerosis of aorta: Secondary | ICD-10-CM | POA: Diagnosis not present

## 2021-05-18 DIAGNOSIS — A4150 Gram-negative sepsis, unspecified: Secondary | ICD-10-CM | POA: Diagnosis not present

## 2021-05-18 DIAGNOSIS — J9601 Acute respiratory failure with hypoxia: Secondary | ICD-10-CM | POA: Diagnosis not present

## 2021-05-18 DIAGNOSIS — R918 Other nonspecific abnormal finding of lung field: Secondary | ICD-10-CM | POA: Diagnosis not present

## 2021-05-18 DIAGNOSIS — J449 Chronic obstructive pulmonary disease, unspecified: Secondary | ICD-10-CM | POA: Diagnosis not present

## 2021-05-19 DIAGNOSIS — J189 Pneumonia, unspecified organism: Secondary | ICD-10-CM | POA: Diagnosis not present

## 2021-05-19 DIAGNOSIS — R0603 Acute respiratory distress: Secondary | ICD-10-CM | POA: Diagnosis not present

## 2021-05-22 DIAGNOSIS — I959 Hypotension, unspecified: Secondary | ICD-10-CM | POA: Diagnosis not present

## 2021-05-23 DIAGNOSIS — N390 Urinary tract infection, site not specified: Secondary | ICD-10-CM | POA: Diagnosis not present

## 2021-05-23 DIAGNOSIS — R531 Weakness: Secondary | ICD-10-CM | POA: Diagnosis not present

## 2021-05-23 DIAGNOSIS — A4151 Sepsis due to Escherichia coli [E. coli]: Secondary | ICD-10-CM | POA: Diagnosis not present

## 2021-05-23 DIAGNOSIS — Z7401 Bed confinement status: Secondary | ICD-10-CM | POA: Diagnosis not present

## 2021-05-23 DIAGNOSIS — R262 Difficulty in walking, not elsewhere classified: Secondary | ICD-10-CM | POA: Diagnosis not present

## 2021-05-23 DIAGNOSIS — R5381 Other malaise: Secondary | ICD-10-CM | POA: Diagnosis not present

## 2021-05-23 DIAGNOSIS — R279 Unspecified lack of coordination: Secondary | ICD-10-CM | POA: Diagnosis not present

## 2021-05-23 DIAGNOSIS — G9341 Metabolic encephalopathy: Secondary | ICD-10-CM | POA: Diagnosis not present

## 2021-05-23 DIAGNOSIS — I10 Essential (primary) hypertension: Secondary | ICD-10-CM | POA: Diagnosis not present

## 2021-05-23 DIAGNOSIS — N400 Enlarged prostate without lower urinary tract symptoms: Secondary | ICD-10-CM | POA: Diagnosis not present

## 2021-05-23 DIAGNOSIS — Z743 Need for continuous supervision: Secondary | ICD-10-CM | POA: Diagnosis not present

## 2021-05-23 DIAGNOSIS — J9621 Acute and chronic respiratory failure with hypoxia: Secondary | ICD-10-CM | POA: Diagnosis not present

## 2021-05-23 DIAGNOSIS — J9601 Acute respiratory failure with hypoxia: Secondary | ICD-10-CM | POA: Diagnosis not present

## 2021-05-23 DIAGNOSIS — G4700 Insomnia, unspecified: Secondary | ICD-10-CM | POA: Diagnosis not present

## 2021-05-23 DIAGNOSIS — I480 Paroxysmal atrial fibrillation: Secondary | ICD-10-CM | POA: Diagnosis not present

## 2021-05-23 DIAGNOSIS — A4150 Gram-negative sepsis, unspecified: Secondary | ICD-10-CM | POA: Diagnosis not present

## 2021-05-23 DIAGNOSIS — I6930 Unspecified sequelae of cerebral infarction: Secondary | ICD-10-CM | POA: Diagnosis not present

## 2021-05-23 DIAGNOSIS — J189 Pneumonia, unspecified organism: Secondary | ICD-10-CM | POA: Diagnosis not present

## 2021-05-26 DIAGNOSIS — J189 Pneumonia, unspecified organism: Secondary | ICD-10-CM | POA: Diagnosis not present

## 2021-05-26 DIAGNOSIS — N390 Urinary tract infection, site not specified: Secondary | ICD-10-CM | POA: Diagnosis not present

## 2021-05-26 DIAGNOSIS — J9621 Acute and chronic respiratory failure with hypoxia: Secondary | ICD-10-CM | POA: Diagnosis not present

## 2021-05-26 DIAGNOSIS — R262 Difficulty in walking, not elsewhere classified: Secondary | ICD-10-CM | POA: Diagnosis not present

## 2021-06-17 DIAGNOSIS — G8929 Other chronic pain: Secondary | ICD-10-CM | POA: Diagnosis not present

## 2021-06-17 DIAGNOSIS — G4700 Insomnia, unspecified: Secondary | ICD-10-CM | POA: Diagnosis not present

## 2021-06-17 DIAGNOSIS — I10 Essential (primary) hypertension: Secondary | ICD-10-CM | POA: Diagnosis not present

## 2021-06-17 DIAGNOSIS — N400 Enlarged prostate without lower urinary tract symptoms: Secondary | ICD-10-CM | POA: Diagnosis not present

## 2021-06-17 DIAGNOSIS — R1310 Dysphagia, unspecified: Secondary | ICD-10-CM | POA: Diagnosis not present

## 2021-06-17 DIAGNOSIS — M24542 Contracture, left hand: Secondary | ICD-10-CM | POA: Diagnosis not present

## 2021-06-17 DIAGNOSIS — A4150 Gram-negative sepsis, unspecified: Secondary | ICD-10-CM | POA: Diagnosis not present

## 2021-06-17 DIAGNOSIS — N390 Urinary tract infection, site not specified: Secondary | ICD-10-CM | POA: Diagnosis not present

## 2021-06-17 DIAGNOSIS — M810 Age-related osteoporosis without current pathological fracture: Secondary | ICD-10-CM | POA: Diagnosis not present

## 2021-06-17 DIAGNOSIS — J189 Pneumonia, unspecified organism: Secondary | ICD-10-CM | POA: Diagnosis not present

## 2021-06-17 DIAGNOSIS — I69354 Hemiplegia and hemiparesis following cerebral infarction affecting left non-dominant side: Secondary | ICD-10-CM | POA: Diagnosis not present

## 2021-06-21 DIAGNOSIS — K219 Gastro-esophageal reflux disease without esophagitis: Secondary | ICD-10-CM | POA: Diagnosis not present

## 2021-06-21 DIAGNOSIS — R6520 Severe sepsis without septic shock: Secondary | ICD-10-CM | POA: Diagnosis not present

## 2021-06-21 DIAGNOSIS — J9601 Acute respiratory failure with hypoxia: Secondary | ICD-10-CM | POA: Diagnosis not present

## 2021-06-21 DIAGNOSIS — J189 Pneumonia, unspecified organism: Secondary | ICD-10-CM | POA: Diagnosis not present

## 2021-06-21 DIAGNOSIS — G9341 Metabolic encephalopathy: Secondary | ICD-10-CM | POA: Diagnosis not present

## 2021-06-21 DIAGNOSIS — A4151 Sepsis due to Escherichia coli [E. coli]: Secondary | ICD-10-CM | POA: Diagnosis not present

## 2021-06-21 DIAGNOSIS — I10 Essential (primary) hypertension: Secondary | ICD-10-CM | POA: Diagnosis not present

## 2021-06-21 DIAGNOSIS — H6121 Impacted cerumen, right ear: Secondary | ICD-10-CM | POA: Diagnosis not present

## 2021-06-22 DIAGNOSIS — I10 Essential (primary) hypertension: Secondary | ICD-10-CM | POA: Diagnosis not present

## 2021-06-22 DIAGNOSIS — G4700 Insomnia, unspecified: Secondary | ICD-10-CM | POA: Diagnosis not present

## 2021-06-22 DIAGNOSIS — J189 Pneumonia, unspecified organism: Secondary | ICD-10-CM | POA: Diagnosis not present

## 2021-06-22 DIAGNOSIS — M810 Age-related osteoporosis without current pathological fracture: Secondary | ICD-10-CM | POA: Diagnosis not present

## 2021-06-22 DIAGNOSIS — N400 Enlarged prostate without lower urinary tract symptoms: Secondary | ICD-10-CM | POA: Diagnosis not present

## 2021-06-22 DIAGNOSIS — R1310 Dysphagia, unspecified: Secondary | ICD-10-CM | POA: Diagnosis not present

## 2021-06-22 DIAGNOSIS — M24542 Contracture, left hand: Secondary | ICD-10-CM | POA: Diagnosis not present

## 2021-06-22 DIAGNOSIS — N390 Urinary tract infection, site not specified: Secondary | ICD-10-CM | POA: Diagnosis not present

## 2021-06-22 DIAGNOSIS — I69354 Hemiplegia and hemiparesis following cerebral infarction affecting left non-dominant side: Secondary | ICD-10-CM | POA: Diagnosis not present

## 2021-06-22 DIAGNOSIS — G8929 Other chronic pain: Secondary | ICD-10-CM | POA: Diagnosis not present

## 2021-06-24 DIAGNOSIS — M24542 Contracture, left hand: Secondary | ICD-10-CM | POA: Diagnosis not present

## 2021-06-24 DIAGNOSIS — I10 Essential (primary) hypertension: Secondary | ICD-10-CM | POA: Diagnosis not present

## 2021-06-24 DIAGNOSIS — I69354 Hemiplegia and hemiparesis following cerebral infarction affecting left non-dominant side: Secondary | ICD-10-CM | POA: Diagnosis not present

## 2021-06-24 DIAGNOSIS — M810 Age-related osteoporosis without current pathological fracture: Secondary | ICD-10-CM | POA: Diagnosis not present

## 2021-06-24 DIAGNOSIS — J189 Pneumonia, unspecified organism: Secondary | ICD-10-CM | POA: Diagnosis not present

## 2021-06-24 DIAGNOSIS — R1310 Dysphagia, unspecified: Secondary | ICD-10-CM | POA: Diagnosis not present

## 2021-06-24 DIAGNOSIS — G8929 Other chronic pain: Secondary | ICD-10-CM | POA: Diagnosis not present

## 2021-06-24 DIAGNOSIS — N400 Enlarged prostate without lower urinary tract symptoms: Secondary | ICD-10-CM | POA: Diagnosis not present

## 2021-06-24 DIAGNOSIS — G4700 Insomnia, unspecified: Secondary | ICD-10-CM | POA: Diagnosis not present

## 2021-06-24 DIAGNOSIS — N390 Urinary tract infection, site not specified: Secondary | ICD-10-CM | POA: Diagnosis not present

## 2021-06-28 DIAGNOSIS — L821 Other seborrheic keratosis: Secondary | ICD-10-CM | POA: Diagnosis not present

## 2021-06-28 DIAGNOSIS — Z85820 Personal history of malignant melanoma of skin: Secondary | ICD-10-CM | POA: Diagnosis not present

## 2021-06-28 DIAGNOSIS — L570 Actinic keratosis: Secondary | ICD-10-CM | POA: Diagnosis not present

## 2021-06-28 DIAGNOSIS — L578 Other skin changes due to chronic exposure to nonionizing radiation: Secondary | ICD-10-CM | POA: Diagnosis not present

## 2021-06-30 DIAGNOSIS — G4700 Insomnia, unspecified: Secondary | ICD-10-CM | POA: Diagnosis not present

## 2021-06-30 DIAGNOSIS — R1310 Dysphagia, unspecified: Secondary | ICD-10-CM | POA: Diagnosis not present

## 2021-06-30 DIAGNOSIS — M24542 Contracture, left hand: Secondary | ICD-10-CM | POA: Diagnosis not present

## 2021-06-30 DIAGNOSIS — N390 Urinary tract infection, site not specified: Secondary | ICD-10-CM | POA: Diagnosis not present

## 2021-06-30 DIAGNOSIS — N400 Enlarged prostate without lower urinary tract symptoms: Secondary | ICD-10-CM | POA: Diagnosis not present

## 2021-06-30 DIAGNOSIS — M810 Age-related osteoporosis without current pathological fracture: Secondary | ICD-10-CM | POA: Diagnosis not present

## 2021-06-30 DIAGNOSIS — I10 Essential (primary) hypertension: Secondary | ICD-10-CM | POA: Diagnosis not present

## 2021-06-30 DIAGNOSIS — I69354 Hemiplegia and hemiparesis following cerebral infarction affecting left non-dominant side: Secondary | ICD-10-CM | POA: Diagnosis not present

## 2021-06-30 DIAGNOSIS — J189 Pneumonia, unspecified organism: Secondary | ICD-10-CM | POA: Diagnosis not present

## 2021-06-30 DIAGNOSIS — G8929 Other chronic pain: Secondary | ICD-10-CM | POA: Diagnosis not present

## 2021-07-01 DIAGNOSIS — I69354 Hemiplegia and hemiparesis following cerebral infarction affecting left non-dominant side: Secondary | ICD-10-CM | POA: Diagnosis not present

## 2021-07-01 DIAGNOSIS — G8929 Other chronic pain: Secondary | ICD-10-CM | POA: Diagnosis not present

## 2021-07-01 DIAGNOSIS — N390 Urinary tract infection, site not specified: Secondary | ICD-10-CM | POA: Diagnosis not present

## 2021-07-01 DIAGNOSIS — N400 Enlarged prostate without lower urinary tract symptoms: Secondary | ICD-10-CM | POA: Diagnosis not present

## 2021-07-01 DIAGNOSIS — I10 Essential (primary) hypertension: Secondary | ICD-10-CM | POA: Diagnosis not present

## 2021-07-01 DIAGNOSIS — G4700 Insomnia, unspecified: Secondary | ICD-10-CM | POA: Diagnosis not present

## 2021-07-01 DIAGNOSIS — J189 Pneumonia, unspecified organism: Secondary | ICD-10-CM | POA: Diagnosis not present

## 2021-07-01 DIAGNOSIS — M24542 Contracture, left hand: Secondary | ICD-10-CM | POA: Diagnosis not present

## 2021-07-01 DIAGNOSIS — M810 Age-related osteoporosis without current pathological fracture: Secondary | ICD-10-CM | POA: Diagnosis not present

## 2021-07-01 DIAGNOSIS — R1310 Dysphagia, unspecified: Secondary | ICD-10-CM | POA: Diagnosis not present

## 2021-07-05 DIAGNOSIS — N390 Urinary tract infection, site not specified: Secondary | ICD-10-CM | POA: Diagnosis not present

## 2021-07-05 DIAGNOSIS — R1310 Dysphagia, unspecified: Secondary | ICD-10-CM | POA: Diagnosis not present

## 2021-07-05 DIAGNOSIS — M810 Age-related osteoporosis without current pathological fracture: Secondary | ICD-10-CM | POA: Diagnosis not present

## 2021-07-05 DIAGNOSIS — G8929 Other chronic pain: Secondary | ICD-10-CM | POA: Diagnosis not present

## 2021-07-05 DIAGNOSIS — N400 Enlarged prostate without lower urinary tract symptoms: Secondary | ICD-10-CM | POA: Diagnosis not present

## 2021-07-05 DIAGNOSIS — M24542 Contracture, left hand: Secondary | ICD-10-CM | POA: Diagnosis not present

## 2021-07-05 DIAGNOSIS — I69354 Hemiplegia and hemiparesis following cerebral infarction affecting left non-dominant side: Secondary | ICD-10-CM | POA: Diagnosis not present

## 2021-07-05 DIAGNOSIS — J189 Pneumonia, unspecified organism: Secondary | ICD-10-CM | POA: Diagnosis not present

## 2021-07-05 DIAGNOSIS — G4700 Insomnia, unspecified: Secondary | ICD-10-CM | POA: Diagnosis not present

## 2021-07-05 DIAGNOSIS — I10 Essential (primary) hypertension: Secondary | ICD-10-CM | POA: Diagnosis not present

## 2021-07-06 DIAGNOSIS — M24542 Contracture, left hand: Secondary | ICD-10-CM | POA: Diagnosis not present

## 2021-07-06 DIAGNOSIS — J189 Pneumonia, unspecified organism: Secondary | ICD-10-CM | POA: Diagnosis not present

## 2021-07-06 DIAGNOSIS — R1310 Dysphagia, unspecified: Secondary | ICD-10-CM | POA: Diagnosis not present

## 2021-07-06 DIAGNOSIS — N400 Enlarged prostate without lower urinary tract symptoms: Secondary | ICD-10-CM | POA: Diagnosis not present

## 2021-07-06 DIAGNOSIS — G8929 Other chronic pain: Secondary | ICD-10-CM | POA: Diagnosis not present

## 2021-07-06 DIAGNOSIS — I10 Essential (primary) hypertension: Secondary | ICD-10-CM | POA: Diagnosis not present

## 2021-07-06 DIAGNOSIS — G4700 Insomnia, unspecified: Secondary | ICD-10-CM | POA: Diagnosis not present

## 2021-07-06 DIAGNOSIS — M810 Age-related osteoporosis without current pathological fracture: Secondary | ICD-10-CM | POA: Diagnosis not present

## 2021-07-06 DIAGNOSIS — I69354 Hemiplegia and hemiparesis following cerebral infarction affecting left non-dominant side: Secondary | ICD-10-CM | POA: Diagnosis not present

## 2021-07-06 DIAGNOSIS — N390 Urinary tract infection, site not specified: Secondary | ICD-10-CM | POA: Diagnosis not present

## 2021-07-08 DIAGNOSIS — I10 Essential (primary) hypertension: Secondary | ICD-10-CM | POA: Diagnosis not present

## 2021-07-08 DIAGNOSIS — M810 Age-related osteoporosis without current pathological fracture: Secondary | ICD-10-CM | POA: Diagnosis not present

## 2021-07-08 DIAGNOSIS — N390 Urinary tract infection, site not specified: Secondary | ICD-10-CM | POA: Diagnosis not present

## 2021-07-08 DIAGNOSIS — M24542 Contracture, left hand: Secondary | ICD-10-CM | POA: Diagnosis not present

## 2021-07-08 DIAGNOSIS — N400 Enlarged prostate without lower urinary tract symptoms: Secondary | ICD-10-CM | POA: Diagnosis not present

## 2021-07-08 DIAGNOSIS — G4700 Insomnia, unspecified: Secondary | ICD-10-CM | POA: Diagnosis not present

## 2021-07-08 DIAGNOSIS — J189 Pneumonia, unspecified organism: Secondary | ICD-10-CM | POA: Diagnosis not present

## 2021-07-08 DIAGNOSIS — I69354 Hemiplegia and hemiparesis following cerebral infarction affecting left non-dominant side: Secondary | ICD-10-CM | POA: Diagnosis not present

## 2021-07-08 DIAGNOSIS — R1310 Dysphagia, unspecified: Secondary | ICD-10-CM | POA: Diagnosis not present

## 2021-07-08 DIAGNOSIS — G8929 Other chronic pain: Secondary | ICD-10-CM | POA: Diagnosis not present

## 2021-07-12 DIAGNOSIS — M24542 Contracture, left hand: Secondary | ICD-10-CM | POA: Diagnosis not present

## 2021-07-12 DIAGNOSIS — R1310 Dysphagia, unspecified: Secondary | ICD-10-CM | POA: Diagnosis not present

## 2021-07-12 DIAGNOSIS — M810 Age-related osteoporosis without current pathological fracture: Secondary | ICD-10-CM | POA: Diagnosis not present

## 2021-07-12 DIAGNOSIS — G4700 Insomnia, unspecified: Secondary | ICD-10-CM | POA: Diagnosis not present

## 2021-07-12 DIAGNOSIS — J189 Pneumonia, unspecified organism: Secondary | ICD-10-CM | POA: Diagnosis not present

## 2021-07-12 DIAGNOSIS — N400 Enlarged prostate without lower urinary tract symptoms: Secondary | ICD-10-CM | POA: Diagnosis not present

## 2021-07-12 DIAGNOSIS — I10 Essential (primary) hypertension: Secondary | ICD-10-CM | POA: Diagnosis not present

## 2021-07-12 DIAGNOSIS — I69354 Hemiplegia and hemiparesis following cerebral infarction affecting left non-dominant side: Secondary | ICD-10-CM | POA: Diagnosis not present

## 2021-07-12 DIAGNOSIS — G8929 Other chronic pain: Secondary | ICD-10-CM | POA: Diagnosis not present

## 2021-07-12 DIAGNOSIS — N390 Urinary tract infection, site not specified: Secondary | ICD-10-CM | POA: Diagnosis not present

## 2021-07-14 DIAGNOSIS — G8929 Other chronic pain: Secondary | ICD-10-CM | POA: Diagnosis not present

## 2021-07-14 DIAGNOSIS — J189 Pneumonia, unspecified organism: Secondary | ICD-10-CM | POA: Diagnosis not present

## 2021-07-14 DIAGNOSIS — I10 Essential (primary) hypertension: Secondary | ICD-10-CM | POA: Diagnosis not present

## 2021-07-14 DIAGNOSIS — M24542 Contracture, left hand: Secondary | ICD-10-CM | POA: Diagnosis not present

## 2021-07-14 DIAGNOSIS — M810 Age-related osteoporosis without current pathological fracture: Secondary | ICD-10-CM | POA: Diagnosis not present

## 2021-07-14 DIAGNOSIS — R1310 Dysphagia, unspecified: Secondary | ICD-10-CM | POA: Diagnosis not present

## 2021-07-14 DIAGNOSIS — I69354 Hemiplegia and hemiparesis following cerebral infarction affecting left non-dominant side: Secondary | ICD-10-CM | POA: Diagnosis not present

## 2021-07-14 DIAGNOSIS — N400 Enlarged prostate without lower urinary tract symptoms: Secondary | ICD-10-CM | POA: Diagnosis not present

## 2021-07-14 DIAGNOSIS — N390 Urinary tract infection, site not specified: Secondary | ICD-10-CM | POA: Diagnosis not present

## 2021-07-14 DIAGNOSIS — G4700 Insomnia, unspecified: Secondary | ICD-10-CM | POA: Diagnosis not present

## 2021-07-15 DIAGNOSIS — R1310 Dysphagia, unspecified: Secondary | ICD-10-CM | POA: Diagnosis not present

## 2021-07-15 DIAGNOSIS — N390 Urinary tract infection, site not specified: Secondary | ICD-10-CM | POA: Diagnosis not present

## 2021-07-15 DIAGNOSIS — G8929 Other chronic pain: Secondary | ICD-10-CM | POA: Diagnosis not present

## 2021-07-15 DIAGNOSIS — G4700 Insomnia, unspecified: Secondary | ICD-10-CM | POA: Diagnosis not present

## 2021-07-15 DIAGNOSIS — N400 Enlarged prostate without lower urinary tract symptoms: Secondary | ICD-10-CM | POA: Diagnosis not present

## 2021-07-15 DIAGNOSIS — M810 Age-related osteoporosis without current pathological fracture: Secondary | ICD-10-CM | POA: Diagnosis not present

## 2021-07-15 DIAGNOSIS — M24542 Contracture, left hand: Secondary | ICD-10-CM | POA: Diagnosis not present

## 2021-07-15 DIAGNOSIS — I69354 Hemiplegia and hemiparesis following cerebral infarction affecting left non-dominant side: Secondary | ICD-10-CM | POA: Diagnosis not present

## 2021-07-15 DIAGNOSIS — J189 Pneumonia, unspecified organism: Secondary | ICD-10-CM | POA: Diagnosis not present

## 2021-07-15 DIAGNOSIS — I10 Essential (primary) hypertension: Secondary | ICD-10-CM | POA: Diagnosis not present

## 2021-07-18 DIAGNOSIS — I69354 Hemiplegia and hemiparesis following cerebral infarction affecting left non-dominant side: Secondary | ICD-10-CM | POA: Diagnosis not present

## 2021-07-18 DIAGNOSIS — I10 Essential (primary) hypertension: Secondary | ICD-10-CM | POA: Diagnosis not present

## 2021-07-18 DIAGNOSIS — N400 Enlarged prostate without lower urinary tract symptoms: Secondary | ICD-10-CM | POA: Diagnosis not present

## 2021-07-18 DIAGNOSIS — G8929 Other chronic pain: Secondary | ICD-10-CM | POA: Diagnosis not present

## 2021-07-18 DIAGNOSIS — M810 Age-related osteoporosis without current pathological fracture: Secondary | ICD-10-CM | POA: Diagnosis not present

## 2021-07-18 DIAGNOSIS — R1310 Dysphagia, unspecified: Secondary | ICD-10-CM | POA: Diagnosis not present

## 2021-07-18 DIAGNOSIS — N390 Urinary tract infection, site not specified: Secondary | ICD-10-CM | POA: Diagnosis not present

## 2021-07-18 DIAGNOSIS — J189 Pneumonia, unspecified organism: Secondary | ICD-10-CM | POA: Diagnosis not present

## 2021-07-18 DIAGNOSIS — M24542 Contracture, left hand: Secondary | ICD-10-CM | POA: Diagnosis not present

## 2021-07-18 DIAGNOSIS — G4700 Insomnia, unspecified: Secondary | ICD-10-CM | POA: Diagnosis not present

## 2021-07-20 DIAGNOSIS — N390 Urinary tract infection, site not specified: Secondary | ICD-10-CM | POA: Diagnosis not present

## 2021-07-20 DIAGNOSIS — R1310 Dysphagia, unspecified: Secondary | ICD-10-CM | POA: Diagnosis not present

## 2021-07-20 DIAGNOSIS — J189 Pneumonia, unspecified organism: Secondary | ICD-10-CM | POA: Diagnosis not present

## 2021-07-20 DIAGNOSIS — G4700 Insomnia, unspecified: Secondary | ICD-10-CM | POA: Diagnosis not present

## 2021-07-20 DIAGNOSIS — M24542 Contracture, left hand: Secondary | ICD-10-CM | POA: Diagnosis not present

## 2021-07-20 DIAGNOSIS — M810 Age-related osteoporosis without current pathological fracture: Secondary | ICD-10-CM | POA: Diagnosis not present

## 2021-07-20 DIAGNOSIS — I69354 Hemiplegia and hemiparesis following cerebral infarction affecting left non-dominant side: Secondary | ICD-10-CM | POA: Diagnosis not present

## 2021-07-20 DIAGNOSIS — I10 Essential (primary) hypertension: Secondary | ICD-10-CM | POA: Diagnosis not present

## 2021-07-20 DIAGNOSIS — N400 Enlarged prostate without lower urinary tract symptoms: Secondary | ICD-10-CM | POA: Diagnosis not present

## 2021-07-20 DIAGNOSIS — G8929 Other chronic pain: Secondary | ICD-10-CM | POA: Diagnosis not present

## 2021-07-26 DIAGNOSIS — N390 Urinary tract infection, site not specified: Secondary | ICD-10-CM | POA: Diagnosis not present

## 2021-07-26 DIAGNOSIS — J189 Pneumonia, unspecified organism: Secondary | ICD-10-CM | POA: Diagnosis not present

## 2021-07-26 DIAGNOSIS — G4700 Insomnia, unspecified: Secondary | ICD-10-CM | POA: Diagnosis not present

## 2021-07-26 DIAGNOSIS — I10 Essential (primary) hypertension: Secondary | ICD-10-CM | POA: Diagnosis not present

## 2021-07-26 DIAGNOSIS — G8929 Other chronic pain: Secondary | ICD-10-CM | POA: Diagnosis not present

## 2021-07-26 DIAGNOSIS — M810 Age-related osteoporosis without current pathological fracture: Secondary | ICD-10-CM | POA: Diagnosis not present

## 2021-07-26 DIAGNOSIS — M24542 Contracture, left hand: Secondary | ICD-10-CM | POA: Diagnosis not present

## 2021-07-26 DIAGNOSIS — I69354 Hemiplegia and hemiparesis following cerebral infarction affecting left non-dominant side: Secondary | ICD-10-CM | POA: Diagnosis not present

## 2021-07-26 DIAGNOSIS — R1310 Dysphagia, unspecified: Secondary | ICD-10-CM | POA: Diagnosis not present

## 2021-07-26 DIAGNOSIS — N400 Enlarged prostate without lower urinary tract symptoms: Secondary | ICD-10-CM | POA: Diagnosis not present

## 2021-08-05 DIAGNOSIS — I10 Essential (primary) hypertension: Secondary | ICD-10-CM | POA: Diagnosis not present

## 2021-08-05 DIAGNOSIS — R1310 Dysphagia, unspecified: Secondary | ICD-10-CM | POA: Diagnosis not present

## 2021-08-05 DIAGNOSIS — M24542 Contracture, left hand: Secondary | ICD-10-CM | POA: Diagnosis not present

## 2021-08-05 DIAGNOSIS — G8929 Other chronic pain: Secondary | ICD-10-CM | POA: Diagnosis not present

## 2021-08-05 DIAGNOSIS — I69354 Hemiplegia and hemiparesis following cerebral infarction affecting left non-dominant side: Secondary | ICD-10-CM | POA: Diagnosis not present

## 2021-08-05 DIAGNOSIS — J189 Pneumonia, unspecified organism: Secondary | ICD-10-CM | POA: Diagnosis not present

## 2021-08-05 DIAGNOSIS — N390 Urinary tract infection, site not specified: Secondary | ICD-10-CM | POA: Diagnosis not present

## 2021-08-05 DIAGNOSIS — N400 Enlarged prostate without lower urinary tract symptoms: Secondary | ICD-10-CM | POA: Diagnosis not present

## 2021-08-05 DIAGNOSIS — M810 Age-related osteoporosis without current pathological fracture: Secondary | ICD-10-CM | POA: Diagnosis not present

## 2021-08-05 DIAGNOSIS — G4700 Insomnia, unspecified: Secondary | ICD-10-CM | POA: Diagnosis not present

## 2021-09-09 DIAGNOSIS — R5383 Other fatigue: Secondary | ICD-10-CM | POA: Diagnosis not present

## 2021-09-09 DIAGNOSIS — E871 Hypo-osmolality and hyponatremia: Secondary | ICD-10-CM | POA: Diagnosis not present

## 2021-09-09 DIAGNOSIS — R531 Weakness: Secondary | ICD-10-CM | POA: Diagnosis not present

## 2021-09-09 DIAGNOSIS — R29898 Other symptoms and signs involving the musculoskeletal system: Secondary | ICD-10-CM | POA: Diagnosis not present

## 2021-09-09 DIAGNOSIS — R5381 Other malaise: Secondary | ICD-10-CM | POA: Diagnosis not present

## 2021-09-09 DIAGNOSIS — R4182 Altered mental status, unspecified: Secondary | ICD-10-CM | POA: Diagnosis not present

## 2021-09-09 DIAGNOSIS — G9341 Metabolic encephalopathy: Secondary | ICD-10-CM | POA: Diagnosis not present

## 2021-09-10 DIAGNOSIS — Z87891 Personal history of nicotine dependence: Secondary | ICD-10-CM | POA: Diagnosis not present

## 2021-09-10 DIAGNOSIS — R531 Weakness: Secondary | ICD-10-CM | POA: Diagnosis not present

## 2021-09-10 DIAGNOSIS — Z79899 Other long term (current) drug therapy: Secondary | ICD-10-CM | POA: Diagnosis not present

## 2021-09-10 DIAGNOSIS — R69 Illness, unspecified: Secondary | ICD-10-CM | POA: Diagnosis not present

## 2021-09-10 DIAGNOSIS — R29898 Other symptoms and signs involving the musculoskeletal system: Secondary | ICD-10-CM | POA: Diagnosis not present

## 2021-09-10 DIAGNOSIS — I480 Paroxysmal atrial fibrillation: Secondary | ICD-10-CM | POA: Diagnosis not present

## 2021-09-10 DIAGNOSIS — G9340 Encephalopathy, unspecified: Secondary | ICD-10-CM | POA: Diagnosis not present

## 2021-09-10 DIAGNOSIS — N400 Enlarged prostate without lower urinary tract symptoms: Secondary | ICD-10-CM | POA: Diagnosis not present

## 2021-09-10 DIAGNOSIS — R5383 Other fatigue: Secondary | ICD-10-CM | POA: Diagnosis not present

## 2021-09-10 DIAGNOSIS — R4182 Altered mental status, unspecified: Secondary | ICD-10-CM | POA: Diagnosis not present

## 2021-09-10 DIAGNOSIS — R112 Nausea with vomiting, unspecified: Secondary | ICD-10-CM | POA: Diagnosis not present

## 2021-09-10 DIAGNOSIS — R0902 Hypoxemia: Secondary | ICD-10-CM | POA: Diagnosis not present

## 2021-09-10 DIAGNOSIS — Z8719 Personal history of other diseases of the digestive system: Secondary | ICD-10-CM | POA: Diagnosis not present

## 2021-09-10 DIAGNOSIS — M1990 Unspecified osteoarthritis, unspecified site: Secondary | ICD-10-CM | POA: Diagnosis not present

## 2021-09-10 DIAGNOSIS — E860 Dehydration: Secondary | ICD-10-CM | POA: Diagnosis not present

## 2021-09-10 DIAGNOSIS — Z8701 Personal history of pneumonia (recurrent): Secondary | ICD-10-CM | POA: Diagnosis not present

## 2021-09-10 DIAGNOSIS — E878 Other disorders of electrolyte and fluid balance, not elsewhere classified: Secondary | ICD-10-CM | POA: Diagnosis not present

## 2021-09-10 DIAGNOSIS — I69954 Hemiplegia and hemiparesis following unspecified cerebrovascular disease affecting left non-dominant side: Secondary | ICD-10-CM | POA: Diagnosis not present

## 2021-09-10 DIAGNOSIS — J449 Chronic obstructive pulmonary disease, unspecified: Secondary | ICD-10-CM | POA: Diagnosis not present

## 2021-09-10 DIAGNOSIS — G9341 Metabolic encephalopathy: Secondary | ICD-10-CM | POA: Diagnosis not present

## 2021-09-10 DIAGNOSIS — E871 Hypo-osmolality and hyponatremia: Secondary | ICD-10-CM | POA: Diagnosis not present

## 2021-09-10 DIAGNOSIS — A084 Viral intestinal infection, unspecified: Secondary | ICD-10-CM | POA: Diagnosis not present

## 2021-09-14 DIAGNOSIS — Z9181 History of falling: Secondary | ICD-10-CM | POA: Diagnosis not present

## 2021-09-14 DIAGNOSIS — M1380 Other specified arthritis, unspecified site: Secondary | ICD-10-CM | POA: Diagnosis not present

## 2021-09-14 DIAGNOSIS — I480 Paroxysmal atrial fibrillation: Secondary | ICD-10-CM | POA: Diagnosis not present

## 2021-09-14 DIAGNOSIS — Z7401 Bed confinement status: Secondary | ICD-10-CM | POA: Diagnosis not present

## 2021-09-14 DIAGNOSIS — I10 Essential (primary) hypertension: Secondary | ICD-10-CM | POA: Diagnosis not present

## 2021-09-14 DIAGNOSIS — M4135 Thoracogenic scoliosis, thoracolumbar region: Secondary | ICD-10-CM | POA: Diagnosis not present

## 2021-09-14 DIAGNOSIS — M810 Age-related osteoporosis without current pathological fracture: Secondary | ICD-10-CM | POA: Diagnosis not present

## 2021-09-14 DIAGNOSIS — E46 Unspecified protein-calorie malnutrition: Secondary | ICD-10-CM | POA: Diagnosis not present

## 2021-09-14 DIAGNOSIS — N400 Enlarged prostate without lower urinary tract symptoms: Secondary | ICD-10-CM | POA: Diagnosis not present

## 2021-09-14 DIAGNOSIS — N390 Urinary tract infection, site not specified: Secondary | ICD-10-CM | POA: Diagnosis not present

## 2021-09-14 DIAGNOSIS — R69 Illness, unspecified: Secondary | ICD-10-CM | POA: Diagnosis not present

## 2021-09-14 DIAGNOSIS — Z741 Need for assistance with personal care: Secondary | ICD-10-CM | POA: Diagnosis not present

## 2021-09-14 DIAGNOSIS — I6930 Unspecified sequelae of cerebral infarction: Secondary | ICD-10-CM | POA: Diagnosis not present

## 2021-09-14 DIAGNOSIS — Z933 Colostomy status: Secondary | ICD-10-CM | POA: Diagnosis not present

## 2021-09-14 DIAGNOSIS — F0390 Unspecified dementia without behavioral disturbance: Secondary | ICD-10-CM | POA: Diagnosis not present

## 2021-09-14 DIAGNOSIS — E871 Hypo-osmolality and hyponatremia: Secondary | ICD-10-CM | POA: Diagnosis not present

## 2021-09-14 DIAGNOSIS — I119 Hypertensive heart disease without heart failure: Secondary | ICD-10-CM | POA: Diagnosis not present

## 2021-09-14 DIAGNOSIS — R531 Weakness: Secondary | ICD-10-CM | POA: Diagnosis not present

## 2021-09-14 DIAGNOSIS — G9341 Metabolic encephalopathy: Secondary | ICD-10-CM | POA: Diagnosis not present

## 2021-09-14 DIAGNOSIS — I69954 Hemiplegia and hemiparesis following unspecified cerebrovascular disease affecting left non-dominant side: Secondary | ICD-10-CM | POA: Diagnosis not present

## 2021-09-14 DIAGNOSIS — M1990 Unspecified osteoarthritis, unspecified site: Secondary | ICD-10-CM | POA: Diagnosis not present

## 2021-09-14 DIAGNOSIS — M24542 Contracture, left hand: Secondary | ICD-10-CM | POA: Diagnosis not present

## 2021-09-14 DIAGNOSIS — M21372 Foot drop, left foot: Secondary | ICD-10-CM | POA: Diagnosis not present

## 2021-09-14 DIAGNOSIS — J449 Chronic obstructive pulmonary disease, unspecified: Secondary | ICD-10-CM | POA: Diagnosis not present

## 2021-09-14 DIAGNOSIS — E860 Dehydration: Secondary | ICD-10-CM | POA: Diagnosis not present

## 2021-09-14 DIAGNOSIS — R262 Difficulty in walking, not elsewhere classified: Secondary | ICD-10-CM | POA: Diagnosis not present

## 2021-09-14 DIAGNOSIS — D649 Anemia, unspecified: Secondary | ICD-10-CM | POA: Diagnosis not present

## 2021-09-14 DIAGNOSIS — R2689 Other abnormalities of gait and mobility: Secondary | ICD-10-CM | POA: Diagnosis not present

## 2021-09-14 DIAGNOSIS — K219 Gastro-esophageal reflux disease without esophagitis: Secondary | ICD-10-CM | POA: Diagnosis not present

## 2021-09-15 DIAGNOSIS — R262 Difficulty in walking, not elsewhere classified: Secondary | ICD-10-CM | POA: Diagnosis not present

## 2021-09-15 DIAGNOSIS — R69 Illness, unspecified: Secondary | ICD-10-CM | POA: Diagnosis not present

## 2021-09-15 DIAGNOSIS — E871 Hypo-osmolality and hyponatremia: Secondary | ICD-10-CM | POA: Diagnosis not present

## 2021-09-15 DIAGNOSIS — I119 Hypertensive heart disease without heart failure: Secondary | ICD-10-CM | POA: Diagnosis not present

## 2021-09-19 DIAGNOSIS — M24542 Contracture, left hand: Secondary | ICD-10-CM | POA: Diagnosis not present

## 2021-09-19 DIAGNOSIS — R262 Difficulty in walking, not elsewhere classified: Secondary | ICD-10-CM | POA: Diagnosis not present

## 2021-09-19 DIAGNOSIS — Z741 Need for assistance with personal care: Secondary | ICD-10-CM | POA: Diagnosis not present

## 2021-09-19 DIAGNOSIS — M1380 Other specified arthritis, unspecified site: Secondary | ICD-10-CM | POA: Diagnosis not present

## 2021-09-19 DIAGNOSIS — M21372 Foot drop, left foot: Secondary | ICD-10-CM | POA: Diagnosis not present

## 2021-09-22 DIAGNOSIS — M24542 Contracture, left hand: Secondary | ICD-10-CM | POA: Diagnosis not present

## 2021-09-22 DIAGNOSIS — R262 Difficulty in walking, not elsewhere classified: Secondary | ICD-10-CM | POA: Diagnosis not present

## 2021-09-22 DIAGNOSIS — Z741 Need for assistance with personal care: Secondary | ICD-10-CM | POA: Diagnosis not present

## 2021-09-22 DIAGNOSIS — M21372 Foot drop, left foot: Secondary | ICD-10-CM | POA: Diagnosis not present

## 2021-09-22 DIAGNOSIS — M1380 Other specified arthritis, unspecified site: Secondary | ICD-10-CM | POA: Diagnosis not present

## 2021-09-26 DIAGNOSIS — Z741 Need for assistance with personal care: Secondary | ICD-10-CM | POA: Diagnosis not present

## 2021-09-26 DIAGNOSIS — M24542 Contracture, left hand: Secondary | ICD-10-CM | POA: Diagnosis not present

## 2021-09-26 DIAGNOSIS — R262 Difficulty in walking, not elsewhere classified: Secondary | ICD-10-CM | POA: Diagnosis not present

## 2021-09-26 DIAGNOSIS — M1380 Other specified arthritis, unspecified site: Secondary | ICD-10-CM | POA: Diagnosis not present

## 2021-09-26 DIAGNOSIS — M21372 Foot drop, left foot: Secondary | ICD-10-CM | POA: Diagnosis not present

## 2021-09-28 DIAGNOSIS — M21372 Foot drop, left foot: Secondary | ICD-10-CM | POA: Diagnosis not present

## 2021-09-28 DIAGNOSIS — M24542 Contracture, left hand: Secondary | ICD-10-CM | POA: Diagnosis not present

## 2021-09-28 DIAGNOSIS — R262 Difficulty in walking, not elsewhere classified: Secondary | ICD-10-CM | POA: Diagnosis not present

## 2021-09-28 DIAGNOSIS — Z741 Need for assistance with personal care: Secondary | ICD-10-CM | POA: Diagnosis not present

## 2021-09-28 DIAGNOSIS — M1380 Other specified arthritis, unspecified site: Secondary | ICD-10-CM | POA: Diagnosis not present

## 2021-10-04 DIAGNOSIS — E46 Unspecified protein-calorie malnutrition: Secondary | ICD-10-CM | POA: Diagnosis not present

## 2021-10-04 DIAGNOSIS — R69 Illness, unspecified: Secondary | ICD-10-CM | POA: Diagnosis not present

## 2021-10-04 DIAGNOSIS — Z9049 Acquired absence of other specified parts of digestive tract: Secondary | ICD-10-CM | POA: Diagnosis not present

## 2021-10-04 DIAGNOSIS — Z79899 Other long term (current) drug therapy: Secondary | ICD-10-CM | POA: Diagnosis not present

## 2021-10-04 DIAGNOSIS — F0394 Unspecified dementia, unspecified severity, with anxiety: Secondary | ICD-10-CM | POA: Diagnosis not present

## 2021-10-04 DIAGNOSIS — Z7982 Long term (current) use of aspirin: Secondary | ICD-10-CM | POA: Diagnosis not present

## 2021-10-04 DIAGNOSIS — G9341 Metabolic encephalopathy: Secondary | ICD-10-CM | POA: Diagnosis not present

## 2021-10-04 DIAGNOSIS — M810 Age-related osteoporosis without current pathological fracture: Secondary | ICD-10-CM | POA: Diagnosis not present

## 2021-10-04 DIAGNOSIS — E860 Dehydration: Secondary | ICD-10-CM | POA: Diagnosis not present

## 2021-10-04 DIAGNOSIS — I480 Paroxysmal atrial fibrillation: Secondary | ICD-10-CM | POA: Diagnosis not present

## 2021-10-04 DIAGNOSIS — Z8701 Personal history of pneumonia (recurrent): Secondary | ICD-10-CM | POA: Diagnosis not present

## 2021-10-04 DIAGNOSIS — Z85828 Personal history of other malignant neoplasm of skin: Secondary | ICD-10-CM | POA: Diagnosis not present

## 2021-10-04 DIAGNOSIS — E871 Hypo-osmolality and hyponatremia: Secondary | ICD-10-CM | POA: Diagnosis not present

## 2021-10-04 DIAGNOSIS — J9601 Acute respiratory failure with hypoxia: Secondary | ICD-10-CM | POA: Diagnosis not present

## 2021-10-04 DIAGNOSIS — N400 Enlarged prostate without lower urinary tract symptoms: Secondary | ICD-10-CM | POA: Diagnosis not present

## 2021-10-04 DIAGNOSIS — I69354 Hemiplegia and hemiparesis following cerebral infarction affecting left non-dominant side: Secondary | ICD-10-CM | POA: Diagnosis not present

## 2021-10-04 DIAGNOSIS — I10 Essential (primary) hypertension: Secondary | ICD-10-CM | POA: Diagnosis not present

## 2021-10-04 DIAGNOSIS — N179 Acute kidney failure, unspecified: Secondary | ICD-10-CM | POA: Diagnosis not present

## 2021-10-04 DIAGNOSIS — F03911 Unspecified dementia, unspecified severity, with agitation: Secondary | ICD-10-CM | POA: Diagnosis not present

## 2021-10-07 DIAGNOSIS — E871 Hypo-osmolality and hyponatremia: Secondary | ICD-10-CM | POA: Diagnosis not present

## 2021-10-07 DIAGNOSIS — I6789 Other cerebrovascular disease: Secondary | ICD-10-CM | POA: Diagnosis not present

## 2021-10-07 DIAGNOSIS — R112 Nausea with vomiting, unspecified: Secondary | ICD-10-CM | POA: Diagnosis not present

## 2021-10-07 DIAGNOSIS — G8114 Spastic hemiplegia affecting left nondominant side: Secondary | ICD-10-CM | POA: Diagnosis not present

## 2021-10-07 DIAGNOSIS — G9341 Metabolic encephalopathy: Secondary | ICD-10-CM | POA: Diagnosis not present

## 2021-10-07 DIAGNOSIS — I10 Essential (primary) hypertension: Secondary | ICD-10-CM | POA: Diagnosis not present

## 2021-10-17 DIAGNOSIS — E871 Hypo-osmolality and hyponatremia: Secondary | ICD-10-CM | POA: Diagnosis not present

## 2021-10-17 DIAGNOSIS — I69354 Hemiplegia and hemiparesis following cerebral infarction affecting left non-dominant side: Secondary | ICD-10-CM | POA: Diagnosis not present

## 2021-10-28 DIAGNOSIS — J385 Laryngeal spasm: Secondary | ICD-10-CM | POA: Diagnosis not present

## 2021-12-01 DIAGNOSIS — Z432 Encounter for attention to ileostomy: Secondary | ICD-10-CM | POA: Diagnosis not present

## 2021-12-27 DIAGNOSIS — L820 Inflamed seborrheic keratosis: Secondary | ICD-10-CM | POA: Diagnosis not present

## 2021-12-27 DIAGNOSIS — L578 Other skin changes due to chronic exposure to nonionizing radiation: Secondary | ICD-10-CM | POA: Diagnosis not present

## 2021-12-27 DIAGNOSIS — L821 Other seborrheic keratosis: Secondary | ICD-10-CM | POA: Diagnosis not present

## 2021-12-27 DIAGNOSIS — D485 Neoplasm of uncertain behavior of skin: Secondary | ICD-10-CM | POA: Diagnosis not present

## 2022-01-03 DIAGNOSIS — S79912A Unspecified injury of left hip, initial encounter: Secondary | ICD-10-CM | POA: Diagnosis not present

## 2022-01-03 DIAGNOSIS — M25552 Pain in left hip: Secondary | ICD-10-CM | POA: Diagnosis not present

## 2022-01-03 DIAGNOSIS — M47816 Spondylosis without myelopathy or radiculopathy, lumbar region: Secondary | ICD-10-CM | POA: Diagnosis not present

## 2022-02-16 ENCOUNTER — Telehealth: Payer: Self-pay

## 2022-02-16 NOTE — Patient Outreach (Signed)
  Care Coordination   02/16/2022 Name: Shawn Wiley MRN: [id number] DOB: 02/03/1936   Care Coordination Outreach Attempts:  An unsuccessful telephone outreach was attempted today to offer the patient information about available care coordination services as a benefit of their health plan.   Follow Up Plan:  Additional outreach attempts will be made to offer the patient care coordination information and services.   Encounter Outcome:  No Answer  Care Coordination Interventions Activated:  No   Care Coordination Interventions:  No, not indicated    Tomasa Rand, RN, BSN, CEN [facility] [employer] [phone]

## 2022-02-24 ENCOUNTER — Telehealth: Payer: Self-pay

## 2022-02-24 NOTE — Patient Outreach (Signed)
  Care Coordination   02/24/2022 Name: Shawn Wiley MRN: [id number] DOB: 04-08-1936   Care Coordination Outreach Attempts:  A second unsuccessful outreach was attempted today to offer the patient with information about available care coordination services as a benefit of their health plan.     Follow Up Plan:  Additional outreach attempts will be made to offer the patient care coordination information and services.   Encounter Outcome:  No Answer  Care Coordination Interventions Activated:  No   Care Coordination Interventions:  No, not indicated    Tomasa Rand, RN, BSN, CEN [facility] [employer] [phone]

## 2022-02-28 DIAGNOSIS — J449 Chronic obstructive pulmonary disease, unspecified: Secondary | ICD-10-CM | POA: Diagnosis not present

## 2022-02-28 DIAGNOSIS — I10 Essential (primary) hypertension: Secondary | ICD-10-CM | POA: Diagnosis not present

## 2022-03-01 ENCOUNTER — Telehealth: Payer: Self-pay

## 2022-03-01 NOTE — Patient Outreach (Signed)
  Care Coordination   03/01/2022 Name: STEPHENS SHREVE MRN: [id number] DOB: 1935/07/17   Care Coordination Outreach Attempts:  A third unsuccessful outreach was attempted today to offer the patient with information about available care coordination services as a benefit of their health plan.   Follow Up Plan:  No further outreach attempts will be made at this time. We have been unable to contact the patient to offer or enroll patient in care coordination services  Encounter Outcome:  No Answer  Care Coordination Interventions Activated:  No   Care Coordination Interventions:  No, not indicated    Tomasa Rand, RN, BSN, CEN [place] Coordinator [phone]

## 2022-03-03 DIAGNOSIS — J208 Acute bronchitis due to other specified organisms: Secondary | ICD-10-CM | POA: Diagnosis not present

## 2022-03-14 DIAGNOSIS — J029 Acute pharyngitis, unspecified: Secondary | ICD-10-CM | POA: Diagnosis not present

## 2022-03-14 DIAGNOSIS — G4700 Insomnia, unspecified: Secondary | ICD-10-CM | POA: Diagnosis not present

## 2022-03-14 DIAGNOSIS — J208 Acute bronchitis due to other specified organisms: Secondary | ICD-10-CM | POA: Diagnosis not present

## 2022-04-26 DIAGNOSIS — G629 Polyneuropathy, unspecified: Secondary | ICD-10-CM | POA: Diagnosis not present

## 2022-04-26 DIAGNOSIS — R32 Unspecified urinary incontinence: Secondary | ICD-10-CM | POA: Diagnosis not present

## 2022-04-26 DIAGNOSIS — F0394 Unspecified dementia, unspecified severity, with anxiety: Secondary | ICD-10-CM | POA: Diagnosis not present

## 2022-04-26 DIAGNOSIS — Z809 Family history of malignant neoplasm, unspecified: Secondary | ICD-10-CM | POA: Diagnosis not present

## 2022-04-26 DIAGNOSIS — K219 Gastro-esophageal reflux disease without esophagitis: Secondary | ICD-10-CM | POA: Diagnosis not present

## 2022-04-26 DIAGNOSIS — J449 Chronic obstructive pulmonary disease, unspecified: Secondary | ICD-10-CM | POA: Diagnosis not present

## 2022-04-26 DIAGNOSIS — R69 Illness, unspecified: Secondary | ICD-10-CM | POA: Diagnosis not present

## 2022-04-26 DIAGNOSIS — Z008 Encounter for other general examination: Secondary | ICD-10-CM | POA: Diagnosis not present

## 2022-04-26 DIAGNOSIS — I69354 Hemiplegia and hemiparesis following cerebral infarction affecting left non-dominant side: Secondary | ICD-10-CM | POA: Diagnosis not present

## 2022-04-26 DIAGNOSIS — Z823 Family history of stroke: Secondary | ICD-10-CM | POA: Diagnosis not present

## 2022-04-26 DIAGNOSIS — M62838 Other muscle spasm: Secondary | ICD-10-CM | POA: Diagnosis not present

## 2022-04-26 DIAGNOSIS — Z8249 Family history of ischemic heart disease and other diseases of the circulatory system: Secondary | ICD-10-CM | POA: Diagnosis not present

## 2022-04-26 DIAGNOSIS — R269 Unspecified abnormalities of gait and mobility: Secondary | ICD-10-CM | POA: Diagnosis not present

## 2022-04-26 DIAGNOSIS — I10 Essential (primary) hypertension: Secondary | ICD-10-CM | POA: Diagnosis not present

## 2022-06-06 DIAGNOSIS — R300 Dysuria: Secondary | ICD-10-CM | POA: Diagnosis not present

## 2022-06-07 DIAGNOSIS — R531 Weakness: Secondary | ICD-10-CM | POA: Diagnosis not present

## 2022-06-07 DIAGNOSIS — N390 Urinary tract infection, site not specified: Secondary | ICD-10-CM | POA: Diagnosis not present

## 2022-06-10 DIAGNOSIS — G9340 Encephalopathy, unspecified: Secondary | ICD-10-CM | POA: Diagnosis not present

## 2022-06-10 DIAGNOSIS — R531 Weakness: Secondary | ICD-10-CM | POA: Diagnosis not present

## 2022-06-12 DIAGNOSIS — Z8673 Personal history of transient ischemic attack (TIA), and cerebral infarction without residual deficits: Secondary | ICD-10-CM | POA: Diagnosis not present

## 2022-06-12 DIAGNOSIS — K219 Gastro-esophageal reflux disease without esophagitis: Secondary | ICD-10-CM | POA: Diagnosis not present

## 2022-06-12 DIAGNOSIS — I480 Paroxysmal atrial fibrillation: Secondary | ICD-10-CM | POA: Diagnosis not present

## 2022-06-12 DIAGNOSIS — R293 Abnormal posture: Secondary | ICD-10-CM | POA: Diagnosis not present

## 2022-06-12 DIAGNOSIS — R41841 Cognitive communication deficit: Secondary | ICD-10-CM | POA: Diagnosis not present

## 2022-06-12 DIAGNOSIS — N400 Enlarged prostate without lower urinary tract symptoms: Secondary | ICD-10-CM | POA: Diagnosis not present

## 2022-06-12 DIAGNOSIS — E559 Vitamin D deficiency, unspecified: Secondary | ICD-10-CM | POA: Diagnosis not present

## 2022-06-12 DIAGNOSIS — R2689 Other abnormalities of gait and mobility: Secondary | ICD-10-CM | POA: Diagnosis not present

## 2022-06-12 DIAGNOSIS — G9341 Metabolic encephalopathy: Secondary | ICD-10-CM | POA: Diagnosis not present

## 2022-06-12 DIAGNOSIS — M6281 Muscle weakness (generalized): Secondary | ICD-10-CM | POA: Diagnosis not present

## 2022-06-12 DIAGNOSIS — J189 Pneumonia, unspecified organism: Secondary | ICD-10-CM | POA: Diagnosis not present

## 2022-06-12 DIAGNOSIS — E871 Hypo-osmolality and hyponatremia: Secondary | ICD-10-CM | POA: Diagnosis not present

## 2022-06-12 DIAGNOSIS — I69354 Hemiplegia and hemiparesis following cerebral infarction affecting left non-dominant side: Secondary | ICD-10-CM | POA: Diagnosis not present

## 2022-06-12 DIAGNOSIS — R4182 Altered mental status, unspecified: Secondary | ICD-10-CM | POA: Diagnosis not present

## 2022-06-12 DIAGNOSIS — E039 Hypothyroidism, unspecified: Secondary | ICD-10-CM | POA: Diagnosis not present

## 2022-06-12 DIAGNOSIS — J159 Unspecified bacterial pneumonia: Secondary | ICD-10-CM | POA: Diagnosis not present

## 2022-06-12 DIAGNOSIS — E119 Type 2 diabetes mellitus without complications: Secondary | ICD-10-CM | POA: Diagnosis not present

## 2022-06-12 DIAGNOSIS — I10 Essential (primary) hypertension: Secondary | ICD-10-CM | POA: Diagnosis not present

## 2022-06-12 DIAGNOSIS — N390 Urinary tract infection, site not specified: Secondary | ICD-10-CM | POA: Diagnosis not present

## 2022-06-12 DIAGNOSIS — R410 Disorientation, unspecified: Secondary | ICD-10-CM | POA: Diagnosis not present

## 2022-06-12 DIAGNOSIS — Z7401 Bed confinement status: Secondary | ICD-10-CM | POA: Diagnosis not present

## 2022-06-13 DIAGNOSIS — E039 Hypothyroidism, unspecified: Secondary | ICD-10-CM | POA: Diagnosis not present

## 2022-06-13 DIAGNOSIS — J159 Unspecified bacterial pneumonia: Secondary | ICD-10-CM | POA: Diagnosis not present

## 2022-06-13 DIAGNOSIS — N390 Urinary tract infection, site not specified: Secondary | ICD-10-CM | POA: Diagnosis not present

## 2022-06-13 DIAGNOSIS — E559 Vitamin D deficiency, unspecified: Secondary | ICD-10-CM | POA: Diagnosis not present

## 2022-06-13 DIAGNOSIS — Z8673 Personal history of transient ischemic attack (TIA), and cerebral infarction without residual deficits: Secondary | ICD-10-CM | POA: Diagnosis not present

## 2022-06-13 DIAGNOSIS — G9341 Metabolic encephalopathy: Secondary | ICD-10-CM | POA: Diagnosis not present

## 2022-06-13 DIAGNOSIS — E119 Type 2 diabetes mellitus without complications: Secondary | ICD-10-CM | POA: Diagnosis not present

## 2022-06-14 DIAGNOSIS — Z8673 Personal history of transient ischemic attack (TIA), and cerebral infarction without residual deficits: Secondary | ICD-10-CM | POA: Diagnosis not present

## 2022-06-14 DIAGNOSIS — J189 Pneumonia, unspecified organism: Secondary | ICD-10-CM | POA: Diagnosis not present

## 2022-06-14 DIAGNOSIS — N390 Urinary tract infection, site not specified: Secondary | ICD-10-CM | POA: Diagnosis not present

## 2022-06-14 DIAGNOSIS — R4182 Altered mental status, unspecified: Secondary | ICD-10-CM | POA: Diagnosis not present

## 2022-06-29 DIAGNOSIS — G9341 Metabolic encephalopathy: Secondary | ICD-10-CM | POA: Diagnosis not present

## 2022-06-29 DIAGNOSIS — Z8673 Personal history of transient ischemic attack (TIA), and cerebral infarction without residual deficits: Secondary | ICD-10-CM | POA: Diagnosis not present

## 2022-06-29 DIAGNOSIS — J159 Unspecified bacterial pneumonia: Secondary | ICD-10-CM | POA: Diagnosis not present

## 2022-06-29 DIAGNOSIS — N390 Urinary tract infection, site not specified: Secondary | ICD-10-CM | POA: Diagnosis not present

## 2022-07-01 DIAGNOSIS — Z7951 Long term (current) use of inhaled steroids: Secondary | ICD-10-CM | POA: Diagnosis not present

## 2022-07-01 DIAGNOSIS — I69354 Hemiplegia and hemiparesis following cerebral infarction affecting left non-dominant side: Secondary | ICD-10-CM | POA: Diagnosis not present

## 2022-07-01 DIAGNOSIS — N390 Urinary tract infection, site not specified: Secondary | ICD-10-CM | POA: Diagnosis not present

## 2022-07-01 DIAGNOSIS — J189 Pneumonia, unspecified organism: Secondary | ICD-10-CM | POA: Diagnosis not present

## 2022-07-01 DIAGNOSIS — G9341 Metabolic encephalopathy: Secondary | ICD-10-CM | POA: Diagnosis not present

## 2022-07-01 DIAGNOSIS — Z933 Colostomy status: Secondary | ICD-10-CM | POA: Diagnosis not present

## 2022-07-01 DIAGNOSIS — I10 Essential (primary) hypertension: Secondary | ICD-10-CM | POA: Diagnosis not present

## 2022-07-01 DIAGNOSIS — N401 Enlarged prostate with lower urinary tract symptoms: Secondary | ICD-10-CM | POA: Diagnosis not present

## 2022-07-01 DIAGNOSIS — R32 Unspecified urinary incontinence: Secondary | ICD-10-CM | POA: Diagnosis not present

## 2022-07-01 DIAGNOSIS — Z433 Encounter for attention to colostomy: Secondary | ICD-10-CM | POA: Diagnosis not present

## 2022-07-01 DIAGNOSIS — Z87440 Personal history of urinary (tract) infections: Secondary | ICD-10-CM | POA: Diagnosis not present

## 2022-07-05 DIAGNOSIS — R443 Hallucinations, unspecified: Secondary | ICD-10-CM | POA: Diagnosis not present

## 2022-07-05 DIAGNOSIS — N3001 Acute cystitis with hematuria: Secondary | ICD-10-CM | POA: Diagnosis not present

## 2022-07-05 DIAGNOSIS — E871 Hypo-osmolality and hyponatremia: Secondary | ICD-10-CM | POA: Diagnosis not present

## 2022-07-05 DIAGNOSIS — J159 Unspecified bacterial pneumonia: Secondary | ICD-10-CM | POA: Diagnosis not present

## 2022-07-05 DIAGNOSIS — G9341 Metabolic encephalopathy: Secondary | ICD-10-CM | POA: Diagnosis not present

## 2022-08-03 DIAGNOSIS — J301 Allergic rhinitis due to pollen: Secondary | ICD-10-CM | POA: Diagnosis not present

## 2022-08-16 DIAGNOSIS — F331 Major depressive disorder, recurrent, moderate: Secondary | ICD-10-CM | POA: Diagnosis not present

## 2022-08-16 DIAGNOSIS — R69 Illness, unspecified: Secondary | ICD-10-CM | POA: Diagnosis not present

## 2022-09-13 DIAGNOSIS — E785 Hyperlipidemia, unspecified: Secondary | ICD-10-CM | POA: Diagnosis not present

## 2022-09-13 DIAGNOSIS — F331 Major depressive disorder, recurrent, moderate: Secondary | ICD-10-CM | POA: Diagnosis not present

## 2022-09-13 DIAGNOSIS — I10 Essential (primary) hypertension: Secondary | ICD-10-CM | POA: Diagnosis not present

## 2022-09-13 DIAGNOSIS — I480 Paroxysmal atrial fibrillation: Secondary | ICD-10-CM | POA: Diagnosis not present

## 2022-09-14 DIAGNOSIS — I10 Essential (primary) hypertension: Secondary | ICD-10-CM | POA: Diagnosis not present

## 2022-09-14 DIAGNOSIS — I480 Paroxysmal atrial fibrillation: Secondary | ICD-10-CM | POA: Diagnosis not present

## 2022-09-14 DIAGNOSIS — E785 Hyperlipidemia, unspecified: Secondary | ICD-10-CM | POA: Diagnosis not present

## 2022-09-19 DIAGNOSIS — R112 Nausea with vomiting, unspecified: Secondary | ICD-10-CM | POA: Diagnosis not present

## 2022-09-19 DIAGNOSIS — R9431 Abnormal electrocardiogram [ECG] [EKG]: Secondary | ICD-10-CM | POA: Diagnosis not present

## 2022-09-19 DIAGNOSIS — E871 Hypo-osmolality and hyponatremia: Secondary | ICD-10-CM | POA: Diagnosis not present

## 2022-09-19 DIAGNOSIS — I69954 Hemiplegia and hemiparesis following unspecified cerebrovascular disease affecting left non-dominant side: Secondary | ICD-10-CM | POA: Diagnosis not present

## 2022-09-19 DIAGNOSIS — K5651 Intestinal adhesions [bands], with partial obstruction: Secondary | ICD-10-CM | POA: Diagnosis not present

## 2022-09-19 DIAGNOSIS — R109 Unspecified abdominal pain: Secondary | ICD-10-CM | POA: Diagnosis not present

## 2022-09-20 DIAGNOSIS — I69954 Hemiplegia and hemiparesis following unspecified cerebrovascular disease affecting left non-dominant side: Secondary | ICD-10-CM | POA: Diagnosis not present

## 2022-09-20 DIAGNOSIS — E871 Hypo-osmolality and hyponatremia: Secondary | ICD-10-CM | POA: Diagnosis not present

## 2022-09-20 DIAGNOSIS — K5651 Intestinal adhesions [bands], with partial obstruction: Secondary | ICD-10-CM | POA: Diagnosis not present

## 2022-09-21 DIAGNOSIS — K5651 Intestinal adhesions [bands], with partial obstruction: Secondary | ICD-10-CM | POA: Diagnosis not present

## 2022-09-21 DIAGNOSIS — E871 Hypo-osmolality and hyponatremia: Secondary | ICD-10-CM | POA: Diagnosis not present

## 2022-09-21 DIAGNOSIS — I69954 Hemiplegia and hemiparesis following unspecified cerebrovascular disease affecting left non-dominant side: Secondary | ICD-10-CM | POA: Diagnosis not present

## 2022-09-28 DIAGNOSIS — I10 Essential (primary) hypertension: Secondary | ICD-10-CM | POA: Diagnosis not present

## 2022-09-28 DIAGNOSIS — I480 Paroxysmal atrial fibrillation: Secondary | ICD-10-CM | POA: Diagnosis not present

## 2022-09-28 DIAGNOSIS — K5651 Intestinal adhesions [bands], with partial obstruction: Secondary | ICD-10-CM | POA: Diagnosis not present

## 2022-09-28 DIAGNOSIS — J208 Acute bronchitis due to other specified organisms: Secondary | ICD-10-CM | POA: Diagnosis not present

## 2022-09-28 DIAGNOSIS — E871 Hypo-osmolality and hyponatremia: Secondary | ICD-10-CM | POA: Diagnosis not present

## 2022-09-28 DIAGNOSIS — Z932 Ileostomy status: Secondary | ICD-10-CM | POA: Diagnosis not present

## 2022-10-09 DIAGNOSIS — H6592 Unspecified nonsuppurative otitis media, left ear: Secondary | ICD-10-CM | POA: Diagnosis not present

## 2022-10-23 ENCOUNTER — Ambulatory Visit: Payer: Medicare HMO | Admitting: Podiatry

## 2022-10-23 DIAGNOSIS — M79675 Pain in left toe(s): Secondary | ICD-10-CM | POA: Diagnosis not present

## 2022-10-23 DIAGNOSIS — B351 Tinea unguium: Secondary | ICD-10-CM | POA: Diagnosis not present

## 2022-10-23 DIAGNOSIS — I6930 Unspecified sequelae of cerebral infarction: Secondary | ICD-10-CM | POA: Diagnosis not present

## 2022-10-23 DIAGNOSIS — M79674 Pain in right toe(s): Secondary | ICD-10-CM | POA: Diagnosis not present

## 2022-10-23 NOTE — Progress Notes (Signed)
  Subjective:  Patient ID: Shawn Wiley, male    DOB: Aug 02, 1935,  MRN: [id number]  No chief complaint on file.   87 y.o. male presents with the above complaint. History confirmed with patient. Patient presenting with pain related to dystrophic thickened elongated nails. Patient is unable to trim own nails related to nail dystrophy and/or mobility issues. Patient does not have a history of T2DM. Patient does have history of stroke that affected his left side.  Objective:  Physical Exam: warm, good capillary refill nail exam onychomycosis of the toenails, onycholysis, and dystrophic nails DP pulses palpable, PT pulses palpable, and protective sensation intact absent motor strength to the left lower extremity Left Foot:  Pain with palpation of nails due to elongation and dystrophic growth.  Right Foot: Pain with palpation of nails due to elongation and dystrophic growth.   Assessment:   1. Pain due to onychomycosis of toenails of both feet   2. History of stroke with residual deficit      Plan:  Patient was evaluated and treated and all questions answered.  #Onychomycosis with pain  -Nails palliatively debrided as below. -Educated on self-care  Procedure: Nail Debridement Rationale: Pain Type of Debridement: manual, sharp debridement. Instrumentation: Nail nipper, rotary burr. Number of Nails: 10  Return in about 3 months (around 01/23/2023) for RFC.         Corinna Gab, DPM [facility] / [facility]

## 2022-11-14 DIAGNOSIS — L821 Other seborrheic keratosis: Secondary | ICD-10-CM | POA: Diagnosis not present

## 2022-11-14 DIAGNOSIS — L820 Inflamed seborrheic keratosis: Secondary | ICD-10-CM | POA: Diagnosis not present

## 2022-11-14 DIAGNOSIS — D2239 Melanocytic nevi of other parts of face: Secondary | ICD-10-CM | POA: Diagnosis not present

## 2022-11-14 DIAGNOSIS — L814 Other melanin hyperpigmentation: Secondary | ICD-10-CM | POA: Diagnosis not present

## 2022-11-14 DIAGNOSIS — D225 Melanocytic nevi of trunk: Secondary | ICD-10-CM | POA: Diagnosis not present

## 2022-12-15 DIAGNOSIS — R350 Frequency of micturition: Secondary | ICD-10-CM | POA: Diagnosis not present

## 2023-06-26 DIAGNOSIS — L820 Inflamed seborrheic keratosis: Secondary | ICD-10-CM | POA: Diagnosis not present

## 2023-06-26 DIAGNOSIS — L821 Other seborrheic keratosis: Secondary | ICD-10-CM | POA: Diagnosis not present

## 2023-06-26 DIAGNOSIS — L720 Epidermal cyst: Secondary | ICD-10-CM | POA: Diagnosis not present

## 2023-06-26 DIAGNOSIS — L578 Other skin changes due to chronic exposure to nonionizing radiation: Secondary | ICD-10-CM | POA: Diagnosis not present

## 2023-06-26 DIAGNOSIS — L570 Actinic keratosis: Secondary | ICD-10-CM | POA: Diagnosis not present

## 2023-07-17 DIAGNOSIS — J301 Allergic rhinitis due to pollen: Secondary | ICD-10-CM | POA: Diagnosis not present

## 2023-08-02 DIAGNOSIS — R300 Dysuria: Secondary | ICD-10-CM | POA: Diagnosis not present

## 2023-08-02 DIAGNOSIS — N390 Urinary tract infection, site not specified: Secondary | ICD-10-CM | POA: Diagnosis not present

## 2023-08-21 DIAGNOSIS — D72829 Elevated white blood cell count, unspecified: Secondary | ICD-10-CM | POA: Diagnosis not present

## 2023-08-21 DIAGNOSIS — M21372 Foot drop, left foot: Secondary | ICD-10-CM | POA: Diagnosis not present

## 2023-08-21 DIAGNOSIS — K5651 Intestinal adhesions [bands], with partial obstruction: Secondary | ICD-10-CM | POA: Diagnosis not present

## 2023-08-21 DIAGNOSIS — C787 Secondary malignant neoplasm of liver and intrahepatic bile duct: Secondary | ICD-10-CM | POA: Diagnosis not present

## 2023-08-22 DIAGNOSIS — K5651 Intestinal adhesions [bands], with partial obstruction: Secondary | ICD-10-CM | POA: Diagnosis not present

## 2023-08-23 DIAGNOSIS — K5651 Intestinal adhesions [bands], with partial obstruction: Secondary | ICD-10-CM | POA: Diagnosis not present

## 2023-08-24 DIAGNOSIS — K5651 Intestinal adhesions [bands], with partial obstruction: Secondary | ICD-10-CM | POA: Diagnosis not present

## 2023-08-31 DIAGNOSIS — E871 Hypo-osmolality and hyponatremia: Secondary | ICD-10-CM | POA: Diagnosis not present

## 2023-08-31 DIAGNOSIS — K435 Parastomal hernia without obstruction or  gangrene: Secondary | ICD-10-CM | POA: Diagnosis not present

## 2023-08-31 DIAGNOSIS — C787 Secondary malignant neoplasm of liver and intrahepatic bile duct: Secondary | ICD-10-CM | POA: Diagnosis not present

## 2023-08-31 DIAGNOSIS — R918 Other nonspecific abnormal finding of lung field: Secondary | ICD-10-CM | POA: Diagnosis not present

## 2023-08-31 DIAGNOSIS — K565 Intestinal adhesions [bands], unspecified as to partial versus complete obstruction: Secondary | ICD-10-CM | POA: Diagnosis not present

## 2023-08-31 DIAGNOSIS — E875 Hyperkalemia: Secondary | ICD-10-CM | POA: Diagnosis not present

## 2023-10-12 DIAGNOSIS — D509 Iron deficiency anemia, unspecified: Secondary | ICD-10-CM | POA: Diagnosis not present

## 2023-10-12 DIAGNOSIS — D531 Other megaloblastic anemias, not elsewhere classified: Secondary | ICD-10-CM | POA: Diagnosis not present

## 2023-10-12 DIAGNOSIS — R531 Weakness: Secondary | ICD-10-CM | POA: Diagnosis not present

## 2023-10-22 DIAGNOSIS — S61211A Laceration without foreign body of left index finger without damage to nail, initial encounter: Secondary | ICD-10-CM | POA: Diagnosis not present

## 2023-10-22 DIAGNOSIS — S61412A Laceration without foreign body of left hand, initial encounter: Secondary | ICD-10-CM | POA: Diagnosis not present

## 2023-10-22 DIAGNOSIS — S60222A Contusion of left hand, initial encounter: Secondary | ICD-10-CM | POA: Diagnosis not present

## 2023-10-26 DIAGNOSIS — R42 Dizziness and giddiness: Secondary | ICD-10-CM | POA: Diagnosis not present

## 2023-10-26 DIAGNOSIS — R112 Nausea with vomiting, unspecified: Secondary | ICD-10-CM | POA: Diagnosis not present

## 2023-10-26 DIAGNOSIS — I10 Essential (primary) hypertension: Secondary | ICD-10-CM | POA: Diagnosis not present

## 2023-10-27 DIAGNOSIS — I340 Nonrheumatic mitral (valve) insufficiency: Secondary | ICD-10-CM | POA: Diagnosis not present

## 2023-10-27 DIAGNOSIS — I361 Nonrheumatic tricuspid (valve) insufficiency: Secondary | ICD-10-CM | POA: Diagnosis not present

## 2023-10-31 DIAGNOSIS — R918 Other nonspecific abnormal finding of lung field: Secondary | ICD-10-CM | POA: Diagnosis not present

## 2023-10-31 DIAGNOSIS — G8194 Hemiplegia, unspecified affecting left nondominant side: Secondary | ICD-10-CM | POA: Diagnosis not present

## 2023-10-31 DIAGNOSIS — R451 Restlessness and agitation: Secondary | ICD-10-CM | POA: Diagnosis not present

## 2023-10-31 DIAGNOSIS — F419 Anxiety disorder, unspecified: Secondary | ICD-10-CM | POA: Diagnosis not present

## 2023-10-31 DIAGNOSIS — E785 Hyperlipidemia, unspecified: Secondary | ICD-10-CM | POA: Diagnosis not present

## 2023-10-31 DIAGNOSIS — I480 Paroxysmal atrial fibrillation: Secondary | ICD-10-CM | POA: Diagnosis not present

## 2023-10-31 DIAGNOSIS — C7931 Secondary malignant neoplasm of brain: Secondary | ICD-10-CM | POA: Diagnosis not present

## 2023-12-31 DEATH — deceased
